# Patient Record
Sex: Female | Born: 1945
Health system: Southern US, Community
[De-identification: ages and names within clinical notes are randomized; demographics above are authoritative.]

## PROBLEM LIST (undated history)

## (undated) DIAGNOSIS — I2510 Atherosclerotic heart disease of native coronary artery without angina pectoris: Secondary | ICD-10-CM

## (undated) DIAGNOSIS — E785 Hyperlipidemia, unspecified: Secondary | ICD-10-CM

## (undated) DIAGNOSIS — I10 Essential (primary) hypertension: Secondary | ICD-10-CM

## (undated) DIAGNOSIS — L4050 Arthropathic psoriasis, unspecified: Secondary | ICD-10-CM

## (undated) HISTORY — PX: CERVICAL DISC SURGERY: SHX588

## (undated) HISTORY — PX: ORIF PATELLA FRACTURE: SUR947

---

## 2001-09-14 ENCOUNTER — Inpatient Hospital Stay (HOSPITAL_COMMUNITY): Admission: EM | Admit: 2001-09-14 | Discharge: 2001-09-17 | Payer: Self-pay | Admitting: Cardiology

## 2011-04-06 HISTORY — PX: ORIF PATELLA FRACTURE: SUR947

## 2011-04-06 HISTORY — PX: APPENDECTOMY: SHX54

## 2015-05-16 DIAGNOSIS — Z86711 Personal history of pulmonary embolism: Secondary | ICD-10-CM | POA: Diagnosis present

## 2015-05-16 DIAGNOSIS — R0789 Other chest pain: Secondary | ICD-10-CM | POA: Insufficient documentation

## 2015-05-16 HISTORY — DX: Personal history of pulmonary embolism: Z86.711

## 2015-07-08 DIAGNOSIS — Z6829 Body mass index (BMI) 29.0-29.9, adult: Secondary | ICD-10-CM | POA: Diagnosis not present

## 2015-07-08 DIAGNOSIS — I959 Hypotension, unspecified: Secondary | ICD-10-CM | POA: Diagnosis not present

## 2015-07-08 DIAGNOSIS — J019 Acute sinusitis, unspecified: Secondary | ICD-10-CM | POA: Diagnosis not present

## 2015-07-22 DIAGNOSIS — E039 Hypothyroidism, unspecified: Secondary | ICD-10-CM | POA: Diagnosis not present

## 2015-07-22 DIAGNOSIS — E1151 Type 2 diabetes mellitus with diabetic peripheral angiopathy without gangrene: Secondary | ICD-10-CM | POA: Diagnosis not present

## 2015-07-22 DIAGNOSIS — E785 Hyperlipidemia, unspecified: Secondary | ICD-10-CM | POA: Diagnosis not present

## 2015-07-22 DIAGNOSIS — E1122 Type 2 diabetes mellitus with diabetic chronic kidney disease: Secondary | ICD-10-CM | POA: Diagnosis not present

## 2015-08-01 DIAGNOSIS — E039 Hypothyroidism, unspecified: Secondary | ICD-10-CM | POA: Diagnosis not present

## 2015-08-01 DIAGNOSIS — R5383 Other fatigue: Secondary | ICD-10-CM | POA: Diagnosis not present

## 2015-08-01 DIAGNOSIS — E785 Hyperlipidemia, unspecified: Secondary | ICD-10-CM | POA: Diagnosis not present

## 2015-08-01 DIAGNOSIS — E1151 Type 2 diabetes mellitus with diabetic peripheral angiopathy without gangrene: Secondary | ICD-10-CM | POA: Diagnosis not present

## 2015-08-01 DIAGNOSIS — E1122 Type 2 diabetes mellitus with diabetic chronic kidney disease: Secondary | ICD-10-CM | POA: Diagnosis not present

## 2015-08-15 DIAGNOSIS — B2790 Infectious mononucleosis, unspecified without complication: Secondary | ICD-10-CM | POA: Diagnosis not present

## 2015-08-15 DIAGNOSIS — Z6828 Body mass index (BMI) 28.0-28.9, adult: Secondary | ICD-10-CM | POA: Diagnosis not present

## 2015-08-15 DIAGNOSIS — E559 Vitamin D deficiency, unspecified: Secondary | ICD-10-CM | POA: Diagnosis not present

## 2015-08-21 DIAGNOSIS — Z6828 Body mass index (BMI) 28.0-28.9, adult: Secondary | ICD-10-CM | POA: Diagnosis not present

## 2015-08-21 DIAGNOSIS — F419 Anxiety disorder, unspecified: Secondary | ICD-10-CM | POA: Diagnosis not present

## 2015-08-21 DIAGNOSIS — G4700 Insomnia, unspecified: Secondary | ICD-10-CM | POA: Diagnosis not present

## 2015-08-21 DIAGNOSIS — F329 Major depressive disorder, single episode, unspecified: Secondary | ICD-10-CM | POA: Diagnosis not present

## 2015-09-25 DIAGNOSIS — Z6831 Body mass index (BMI) 31.0-31.9, adult: Secondary | ICD-10-CM | POA: Diagnosis not present

## 2015-09-25 DIAGNOSIS — R197 Diarrhea, unspecified: Secondary | ICD-10-CM | POA: Diagnosis not present

## 2015-09-25 DIAGNOSIS — R109 Unspecified abdominal pain: Secondary | ICD-10-CM | POA: Diagnosis not present

## 2015-09-26 DIAGNOSIS — R197 Diarrhea, unspecified: Secondary | ICD-10-CM | POA: Diagnosis not present

## 2015-11-06 DIAGNOSIS — Z7189 Other specified counseling: Secondary | ICD-10-CM | POA: Diagnosis not present

## 2015-11-06 DIAGNOSIS — M2550 Pain in unspecified joint: Secondary | ICD-10-CM | POA: Diagnosis not present

## 2015-11-06 DIAGNOSIS — Z6831 Body mass index (BMI) 31.0-31.9, adult: Secondary | ICD-10-CM | POA: Diagnosis not present

## 2015-11-06 DIAGNOSIS — R5383 Other fatigue: Secondary | ICD-10-CM | POA: Diagnosis not present

## 2015-11-06 DIAGNOSIS — M791 Myalgia: Secondary | ICD-10-CM | POA: Diagnosis not present

## 2015-11-06 DIAGNOSIS — B2790 Infectious mononucleosis, unspecified without complication: Secondary | ICD-10-CM | POA: Diagnosis not present

## 2015-11-28 DIAGNOSIS — E119 Type 2 diabetes mellitus without complications: Secondary | ICD-10-CM | POA: Diagnosis not present

## 2015-11-28 DIAGNOSIS — I252 Old myocardial infarction: Secondary | ICD-10-CM | POA: Diagnosis not present

## 2015-11-28 DIAGNOSIS — R0602 Shortness of breath: Secondary | ICD-10-CM | POA: Diagnosis not present

## 2015-11-28 DIAGNOSIS — Z79899 Other long term (current) drug therapy: Secondary | ICD-10-CM | POA: Diagnosis not present

## 2015-11-28 DIAGNOSIS — I10 Essential (primary) hypertension: Secondary | ICD-10-CM | POA: Diagnosis not present

## 2015-11-28 DIAGNOSIS — E785 Hyperlipidemia, unspecified: Secondary | ICD-10-CM | POA: Diagnosis not present

## 2015-11-28 DIAGNOSIS — E039 Hypothyroidism, unspecified: Secondary | ICD-10-CM | POA: Diagnosis not present

## 2015-11-28 DIAGNOSIS — R079 Chest pain, unspecified: Secondary | ICD-10-CM | POA: Diagnosis not present

## 2015-11-28 DIAGNOSIS — Z86718 Personal history of other venous thrombosis and embolism: Secondary | ICD-10-CM | POA: Diagnosis not present

## 2015-11-28 DIAGNOSIS — Z7901 Long term (current) use of anticoagulants: Secondary | ICD-10-CM | POA: Diagnosis not present

## 2015-11-28 DIAGNOSIS — I249 Acute ischemic heart disease, unspecified: Secondary | ICD-10-CM | POA: Diagnosis not present

## 2015-11-28 DIAGNOSIS — D6851 Activated protein C resistance: Secondary | ICD-10-CM | POA: Diagnosis not present

## 2015-11-28 DIAGNOSIS — R0789 Other chest pain: Secondary | ICD-10-CM | POA: Diagnosis not present

## 2015-11-29 DIAGNOSIS — E039 Hypothyroidism, unspecified: Secondary | ICD-10-CM

## 2015-11-29 DIAGNOSIS — R079 Chest pain, unspecified: Secondary | ICD-10-CM | POA: Diagnosis not present

## 2015-11-29 DIAGNOSIS — E785 Hyperlipidemia, unspecified: Secondary | ICD-10-CM | POA: Diagnosis not present

## 2015-11-29 DIAGNOSIS — Z86711 Personal history of pulmonary embolism: Secondary | ICD-10-CM | POA: Diagnosis not present

## 2015-11-29 DIAGNOSIS — E119 Type 2 diabetes mellitus without complications: Secondary | ICD-10-CM

## 2015-11-29 DIAGNOSIS — I10 Essential (primary) hypertension: Secondary | ICD-10-CM | POA: Diagnosis not present

## 2015-12-03 DIAGNOSIS — E1151 Type 2 diabetes mellitus with diabetic peripheral angiopathy without gangrene: Secondary | ICD-10-CM | POA: Diagnosis not present

## 2015-12-03 DIAGNOSIS — E1122 Type 2 diabetes mellitus with diabetic chronic kidney disease: Secondary | ICD-10-CM | POA: Diagnosis not present

## 2015-12-03 DIAGNOSIS — E785 Hyperlipidemia, unspecified: Secondary | ICD-10-CM | POA: Diagnosis not present

## 2015-12-04 DIAGNOSIS — M2550 Pain in unspecified joint: Secondary | ICD-10-CM | POA: Diagnosis not present

## 2015-12-04 DIAGNOSIS — E785 Hyperlipidemia, unspecified: Secondary | ICD-10-CM | POA: Diagnosis not present

## 2015-12-04 DIAGNOSIS — E1151 Type 2 diabetes mellitus with diabetic peripheral angiopathy without gangrene: Secondary | ICD-10-CM | POA: Diagnosis not present

## 2015-12-04 DIAGNOSIS — R079 Chest pain, unspecified: Secondary | ICD-10-CM | POA: Diagnosis not present

## 2015-12-18 DIAGNOSIS — Z1231 Encounter for screening mammogram for malignant neoplasm of breast: Secondary | ICD-10-CM | POA: Diagnosis not present

## 2015-12-26 DIAGNOSIS — E039 Hypothyroidism, unspecified: Secondary | ICD-10-CM | POA: Diagnosis not present

## 2015-12-26 DIAGNOSIS — Z139 Encounter for screening, unspecified: Secondary | ICD-10-CM | POA: Diagnosis not present

## 2015-12-26 DIAGNOSIS — Z0000 Encounter for general adult medical examination without abnormal findings: Secondary | ICD-10-CM | POA: Diagnosis not present

## 2015-12-26 DIAGNOSIS — Z9181 History of falling: Secondary | ICD-10-CM | POA: Diagnosis not present

## 2015-12-26 DIAGNOSIS — E1151 Type 2 diabetes mellitus with diabetic peripheral angiopathy without gangrene: Secondary | ICD-10-CM | POA: Diagnosis not present

## 2015-12-26 DIAGNOSIS — E785 Hyperlipidemia, unspecified: Secondary | ICD-10-CM | POA: Diagnosis not present

## 2015-12-26 DIAGNOSIS — E1122 Type 2 diabetes mellitus with diabetic chronic kidney disease: Secondary | ICD-10-CM | POA: Diagnosis not present

## 2015-12-26 DIAGNOSIS — M609 Myositis, unspecified: Secondary | ICD-10-CM | POA: Diagnosis not present

## 2015-12-26 DIAGNOSIS — Z1389 Encounter for screening for other disorder: Secondary | ICD-10-CM | POA: Diagnosis not present

## 2015-12-26 DIAGNOSIS — M2550 Pain in unspecified joint: Secondary | ICD-10-CM | POA: Diagnosis not present

## 2016-01-01 DIAGNOSIS — R202 Paresthesia of skin: Secondary | ICD-10-CM | POA: Diagnosis not present

## 2016-01-01 DIAGNOSIS — E785 Hyperlipidemia, unspecified: Secondary | ICD-10-CM | POA: Diagnosis not present

## 2016-01-01 DIAGNOSIS — G8929 Other chronic pain: Secondary | ICD-10-CM | POA: Diagnosis not present

## 2016-01-01 DIAGNOSIS — M25562 Pain in left knee: Secondary | ICD-10-CM | POA: Diagnosis not present

## 2016-01-02 DIAGNOSIS — M25562 Pain in left knee: Secondary | ICD-10-CM | POA: Diagnosis not present

## 2016-01-02 DIAGNOSIS — M179 Osteoarthritis of knee, unspecified: Secondary | ICD-10-CM | POA: Diagnosis not present

## 2016-01-02 DIAGNOSIS — M25462 Effusion, left knee: Secondary | ICD-10-CM | POA: Diagnosis not present

## 2016-01-12 DIAGNOSIS — M1712 Unilateral primary osteoarthritis, left knee: Secondary | ICD-10-CM | POA: Diagnosis not present

## 2016-01-12 DIAGNOSIS — M25562 Pain in left knee: Secondary | ICD-10-CM | POA: Diagnosis not present

## 2016-01-13 DIAGNOSIS — M791 Myalgia: Secondary | ICD-10-CM | POA: Diagnosis not present

## 2016-01-13 DIAGNOSIS — T466X5A Adverse effect of antihyperlipidemic and antiarteriosclerotic drugs, initial encounter: Secondary | ICD-10-CM | POA: Diagnosis not present

## 2016-01-13 DIAGNOSIS — Z6830 Body mass index (BMI) 30.0-30.9, adult: Secondary | ICD-10-CM | POA: Diagnosis not present

## 2016-01-21 DIAGNOSIS — Z6830 Body mass index (BMI) 30.0-30.9, adult: Secondary | ICD-10-CM | POA: Diagnosis not present

## 2016-01-21 DIAGNOSIS — J019 Acute sinusitis, unspecified: Secondary | ICD-10-CM | POA: Diagnosis not present

## 2016-01-21 DIAGNOSIS — F418 Other specified anxiety disorders: Secondary | ICD-10-CM | POA: Diagnosis not present

## 2016-02-03 DIAGNOSIS — G4700 Insomnia, unspecified: Secondary | ICD-10-CM | POA: Diagnosis not present

## 2016-02-03 DIAGNOSIS — M791 Myalgia: Secondary | ICD-10-CM | POA: Diagnosis not present

## 2016-02-03 DIAGNOSIS — Z6830 Body mass index (BMI) 30.0-30.9, adult: Secondary | ICD-10-CM | POA: Diagnosis not present

## 2016-02-09 DIAGNOSIS — M1712 Unilateral primary osteoarthritis, left knee: Secondary | ICD-10-CM | POA: Diagnosis not present

## 2016-02-25 DIAGNOSIS — R748 Abnormal levels of other serum enzymes: Secondary | ICD-10-CM | POA: Diagnosis not present

## 2016-02-25 DIAGNOSIS — E1122 Type 2 diabetes mellitus with diabetic chronic kidney disease: Secondary | ICD-10-CM | POA: Diagnosis not present

## 2016-02-25 DIAGNOSIS — E1151 Type 2 diabetes mellitus with diabetic peripheral angiopathy without gangrene: Secondary | ICD-10-CM | POA: Diagnosis not present

## 2016-02-25 DIAGNOSIS — E785 Hyperlipidemia, unspecified: Secondary | ICD-10-CM | POA: Diagnosis not present

## 2016-02-26 DIAGNOSIS — M1712 Unilateral primary osteoarthritis, left knee: Secondary | ICD-10-CM | POA: Diagnosis not present

## 2016-03-08 DIAGNOSIS — E1151 Type 2 diabetes mellitus with diabetic peripheral angiopathy without gangrene: Secondary | ICD-10-CM | POA: Diagnosis not present

## 2016-03-08 DIAGNOSIS — I129 Hypertensive chronic kidney disease with stage 1 through stage 4 chronic kidney disease, or unspecified chronic kidney disease: Secondary | ICD-10-CM | POA: Diagnosis not present

## 2016-03-08 DIAGNOSIS — E785 Hyperlipidemia, unspecified: Secondary | ICD-10-CM | POA: Diagnosis not present

## 2016-03-08 DIAGNOSIS — Z23 Encounter for immunization: Secondary | ICD-10-CM | POA: Diagnosis not present

## 2016-03-08 DIAGNOSIS — E1122 Type 2 diabetes mellitus with diabetic chronic kidney disease: Secondary | ICD-10-CM | POA: Diagnosis not present

## 2016-04-13 DIAGNOSIS — R072 Precordial pain: Secondary | ICD-10-CM | POA: Diagnosis not present

## 2016-04-13 DIAGNOSIS — I252 Old myocardial infarction: Secondary | ICD-10-CM | POA: Diagnosis not present

## 2016-04-13 DIAGNOSIS — R0602 Shortness of breath: Secondary | ICD-10-CM | POA: Diagnosis not present

## 2016-04-13 DIAGNOSIS — R079 Chest pain, unspecified: Secondary | ICD-10-CM | POA: Diagnosis not present

## 2016-05-31 DIAGNOSIS — E119 Type 2 diabetes mellitus without complications: Secondary | ICD-10-CM | POA: Diagnosis not present

## 2016-05-31 DIAGNOSIS — Z961 Presence of intraocular lens: Secondary | ICD-10-CM | POA: Diagnosis not present

## 2016-06-15 DIAGNOSIS — Z6831 Body mass index (BMI) 31.0-31.9, adult: Secondary | ICD-10-CM | POA: Diagnosis not present

## 2016-06-15 DIAGNOSIS — R05 Cough: Secondary | ICD-10-CM | POA: Diagnosis not present

## 2016-06-22 DIAGNOSIS — E1151 Type 2 diabetes mellitus with diabetic peripheral angiopathy without gangrene: Secondary | ICD-10-CM | POA: Diagnosis not present

## 2016-06-22 DIAGNOSIS — E1169 Type 2 diabetes mellitus with other specified complication: Secondary | ICD-10-CM | POA: Diagnosis not present

## 2016-06-22 DIAGNOSIS — E1122 Type 2 diabetes mellitus with diabetic chronic kidney disease: Secondary | ICD-10-CM | POA: Diagnosis not present

## 2016-07-06 DIAGNOSIS — E1151 Type 2 diabetes mellitus with diabetic peripheral angiopathy without gangrene: Secondary | ICD-10-CM | POA: Diagnosis not present

## 2016-07-06 DIAGNOSIS — I129 Hypertensive chronic kidney disease with stage 1 through stage 4 chronic kidney disease, or unspecified chronic kidney disease: Secondary | ICD-10-CM | POA: Diagnosis not present

## 2016-07-06 DIAGNOSIS — N182 Chronic kidney disease, stage 2 (mild): Secondary | ICD-10-CM | POA: Diagnosis not present

## 2016-07-06 DIAGNOSIS — E785 Hyperlipidemia, unspecified: Secondary | ICD-10-CM | POA: Diagnosis not present

## 2016-08-09 DIAGNOSIS — M1712 Unilateral primary osteoarthritis, left knee: Secondary | ICD-10-CM | POA: Diagnosis not present

## 2016-08-10 DIAGNOSIS — M1712 Unilateral primary osteoarthritis, left knee: Secondary | ICD-10-CM | POA: Diagnosis not present

## 2016-08-30 DIAGNOSIS — M1712 Unilateral primary osteoarthritis, left knee: Secondary | ICD-10-CM | POA: Diagnosis not present

## 2016-09-09 DIAGNOSIS — Z6831 Body mass index (BMI) 31.0-31.9, adult: Secondary | ICD-10-CM | POA: Diagnosis not present

## 2016-09-09 DIAGNOSIS — M791 Myalgia: Secondary | ICD-10-CM | POA: Diagnosis not present

## 2016-09-23 DIAGNOSIS — Z6832 Body mass index (BMI) 32.0-32.9, adult: Secondary | ICD-10-CM | POA: Diagnosis not present

## 2016-09-23 DIAGNOSIS — N182 Chronic kidney disease, stage 2 (mild): Secondary | ICD-10-CM | POA: Diagnosis not present

## 2016-09-23 DIAGNOSIS — E1122 Type 2 diabetes mellitus with diabetic chronic kidney disease: Secondary | ICD-10-CM | POA: Diagnosis not present

## 2016-09-23 DIAGNOSIS — E1151 Type 2 diabetes mellitus with diabetic peripheral angiopathy without gangrene: Secondary | ICD-10-CM | POA: Diagnosis not present

## 2016-09-23 DIAGNOSIS — I129 Hypertensive chronic kidney disease with stage 1 through stage 4 chronic kidney disease, or unspecified chronic kidney disease: Secondary | ICD-10-CM | POA: Diagnosis not present

## 2016-11-02 DIAGNOSIS — E1169 Type 2 diabetes mellitus with other specified complication: Secondary | ICD-10-CM | POA: Diagnosis not present

## 2016-11-02 DIAGNOSIS — E1122 Type 2 diabetes mellitus with diabetic chronic kidney disease: Secondary | ICD-10-CM | POA: Diagnosis not present

## 2016-11-02 DIAGNOSIS — E1151 Type 2 diabetes mellitus with diabetic peripheral angiopathy without gangrene: Secondary | ICD-10-CM | POA: Diagnosis not present

## 2016-11-09 DIAGNOSIS — N182 Chronic kidney disease, stage 2 (mild): Secondary | ICD-10-CM | POA: Diagnosis not present

## 2016-11-09 DIAGNOSIS — I129 Hypertensive chronic kidney disease with stage 1 through stage 4 chronic kidney disease, or unspecified chronic kidney disease: Secondary | ICD-10-CM | POA: Diagnosis not present

## 2016-11-09 DIAGNOSIS — E1122 Type 2 diabetes mellitus with diabetic chronic kidney disease: Secondary | ICD-10-CM | POA: Diagnosis not present

## 2016-11-09 DIAGNOSIS — Z6831 Body mass index (BMI) 31.0-31.9, adult: Secondary | ICD-10-CM | POA: Diagnosis not present

## 2016-11-09 DIAGNOSIS — E785 Hyperlipidemia, unspecified: Secondary | ICD-10-CM | POA: Diagnosis not present

## 2016-12-09 DIAGNOSIS — M5430 Sciatica, unspecified side: Secondary | ICD-10-CM | POA: Diagnosis not present

## 2016-12-09 DIAGNOSIS — Z139 Encounter for screening, unspecified: Secondary | ICD-10-CM | POA: Diagnosis not present

## 2016-12-09 DIAGNOSIS — Z6830 Body mass index (BMI) 30.0-30.9, adult: Secondary | ICD-10-CM | POA: Diagnosis not present

## 2016-12-10 DIAGNOSIS — F3160 Bipolar disorder, current episode mixed, unspecified: Secondary | ICD-10-CM | POA: Diagnosis not present

## 2016-12-10 DIAGNOSIS — I10 Essential (primary) hypertension: Secondary | ICD-10-CM | POA: Diagnosis not present

## 2016-12-10 DIAGNOSIS — I219 Acute myocardial infarction, unspecified: Secondary | ICD-10-CM | POA: Diagnosis not present

## 2016-12-10 DIAGNOSIS — M79609 Pain in unspecified limb: Secondary | ICD-10-CM | POA: Diagnosis not present

## 2016-12-10 DIAGNOSIS — F339 Major depressive disorder, recurrent, unspecified: Secondary | ICD-10-CM | POA: Diagnosis not present

## 2016-12-24 DIAGNOSIS — J305 Allergic rhinitis due to food: Secondary | ICD-10-CM | POA: Diagnosis not present

## 2016-12-24 DIAGNOSIS — Z0000 Encounter for general adult medical examination without abnormal findings: Secondary | ICD-10-CM | POA: Diagnosis not present

## 2016-12-24 DIAGNOSIS — M5430 Sciatica, unspecified side: Secondary | ICD-10-CM | POA: Diagnosis not present

## 2016-12-24 DIAGNOSIS — G4700 Insomnia, unspecified: Secondary | ICD-10-CM | POA: Diagnosis not present

## 2016-12-24 DIAGNOSIS — J302 Other seasonal allergic rhinitis: Secondary | ICD-10-CM | POA: Diagnosis not present

## 2016-12-24 DIAGNOSIS — Z6830 Body mass index (BMI) 30.0-30.9, adult: Secondary | ICD-10-CM | POA: Diagnosis not present

## 2016-12-24 DIAGNOSIS — Z139 Encounter for screening, unspecified: Secondary | ICD-10-CM | POA: Diagnosis not present

## 2016-12-24 DIAGNOSIS — Z1389 Encounter for screening for other disorder: Secondary | ICD-10-CM | POA: Diagnosis not present

## 2016-12-27 DIAGNOSIS — J305 Allergic rhinitis due to food: Secondary | ICD-10-CM | POA: Diagnosis not present

## 2016-12-27 DIAGNOSIS — J302 Other seasonal allergic rhinitis: Secondary | ICD-10-CM | POA: Diagnosis not present

## 2016-12-28 DIAGNOSIS — J305 Allergic rhinitis due to food: Secondary | ICD-10-CM | POA: Diagnosis not present

## 2016-12-28 DIAGNOSIS — J302 Other seasonal allergic rhinitis: Secondary | ICD-10-CM | POA: Diagnosis not present

## 2016-12-29 DIAGNOSIS — J305 Allergic rhinitis due to food: Secondary | ICD-10-CM | POA: Diagnosis not present

## 2016-12-29 DIAGNOSIS — J302 Other seasonal allergic rhinitis: Secondary | ICD-10-CM | POA: Diagnosis not present

## 2016-12-30 DIAGNOSIS — J305 Allergic rhinitis due to food: Secondary | ICD-10-CM | POA: Diagnosis not present

## 2016-12-30 DIAGNOSIS — J302 Other seasonal allergic rhinitis: Secondary | ICD-10-CM | POA: Diagnosis not present

## 2016-12-31 DIAGNOSIS — J305 Allergic rhinitis due to food: Secondary | ICD-10-CM | POA: Diagnosis not present

## 2016-12-31 DIAGNOSIS — J302 Other seasonal allergic rhinitis: Secondary | ICD-10-CM | POA: Diagnosis not present

## 2017-01-03 DIAGNOSIS — J305 Allergic rhinitis due to food: Secondary | ICD-10-CM | POA: Diagnosis not present

## 2017-01-03 DIAGNOSIS — J302 Other seasonal allergic rhinitis: Secondary | ICD-10-CM | POA: Diagnosis not present

## 2017-01-04 DIAGNOSIS — J305 Allergic rhinitis due to food: Secondary | ICD-10-CM | POA: Diagnosis not present

## 2017-01-04 DIAGNOSIS — J302 Other seasonal allergic rhinitis: Secondary | ICD-10-CM | POA: Diagnosis not present

## 2017-01-05 DIAGNOSIS — J302 Other seasonal allergic rhinitis: Secondary | ICD-10-CM | POA: Diagnosis not present

## 2017-01-05 DIAGNOSIS — J305 Allergic rhinitis due to food: Secondary | ICD-10-CM | POA: Diagnosis not present

## 2017-01-06 DIAGNOSIS — J302 Other seasonal allergic rhinitis: Secondary | ICD-10-CM | POA: Diagnosis not present

## 2017-01-06 DIAGNOSIS — J305 Allergic rhinitis due to food: Secondary | ICD-10-CM | POA: Diagnosis not present

## 2017-02-17 DIAGNOSIS — Z1231 Encounter for screening mammogram for malignant neoplasm of breast: Secondary | ICD-10-CM | POA: Diagnosis not present

## 2017-02-17 DIAGNOSIS — Z23 Encounter for immunization: Secondary | ICD-10-CM | POA: Diagnosis not present

## 2017-02-17 DIAGNOSIS — Z6831 Body mass index (BMI) 31.0-31.9, adult: Secondary | ICD-10-CM | POA: Diagnosis not present

## 2017-02-17 DIAGNOSIS — M2550 Pain in unspecified joint: Secondary | ICD-10-CM | POA: Diagnosis not present

## 2017-03-02 DIAGNOSIS — Z1231 Encounter for screening mammogram for malignant neoplasm of breast: Secondary | ICD-10-CM | POA: Diagnosis not present

## 2017-03-10 DIAGNOSIS — E785 Hyperlipidemia, unspecified: Secondary | ICD-10-CM | POA: Diagnosis not present

## 2017-03-10 DIAGNOSIS — E1122 Type 2 diabetes mellitus with diabetic chronic kidney disease: Secondary | ICD-10-CM | POA: Diagnosis not present

## 2017-03-10 DIAGNOSIS — E1151 Type 2 diabetes mellitus with diabetic peripheral angiopathy without gangrene: Secondary | ICD-10-CM | POA: Diagnosis not present

## 2017-03-21 DIAGNOSIS — I129 Hypertensive chronic kidney disease with stage 1 through stage 4 chronic kidney disease, or unspecified chronic kidney disease: Secondary | ICD-10-CM | POA: Diagnosis not present

## 2017-03-21 DIAGNOSIS — R319 Hematuria, unspecified: Secondary | ICD-10-CM | POA: Diagnosis not present

## 2017-03-21 DIAGNOSIS — Z6831 Body mass index (BMI) 31.0-31.9, adult: Secondary | ICD-10-CM | POA: Diagnosis not present

## 2017-03-21 DIAGNOSIS — E1122 Type 2 diabetes mellitus with diabetic chronic kidney disease: Secondary | ICD-10-CM | POA: Diagnosis not present

## 2017-03-21 DIAGNOSIS — E039 Hypothyroidism, unspecified: Secondary | ICD-10-CM | POA: Diagnosis not present

## 2017-03-21 DIAGNOSIS — R0609 Other forms of dyspnea: Secondary | ICD-10-CM | POA: Diagnosis not present

## 2017-03-21 DIAGNOSIS — N182 Chronic kidney disease, stage 2 (mild): Secondary | ICD-10-CM | POA: Diagnosis not present

## 2017-03-25 DIAGNOSIS — J00 Acute nasopharyngitis [common cold]: Secondary | ICD-10-CM | POA: Diagnosis not present

## 2017-05-31 DIAGNOSIS — E119 Type 2 diabetes mellitus without complications: Secondary | ICD-10-CM | POA: Diagnosis not present

## 2017-05-31 DIAGNOSIS — Z961 Presence of intraocular lens: Secondary | ICD-10-CM | POA: Diagnosis not present

## 2019-09-19 DIAGNOSIS — Z6832 Body mass index (BMI) 32.0-32.9, adult: Secondary | ICD-10-CM | POA: Diagnosis not present

## 2019-09-19 DIAGNOSIS — D682 Hereditary deficiency of other clotting factors: Secondary | ICD-10-CM | POA: Diagnosis not present

## 2019-09-19 DIAGNOSIS — S39012A Strain of muscle, fascia and tendon of lower back, initial encounter: Secondary | ICD-10-CM | POA: Diagnosis not present

## 2019-09-20 DIAGNOSIS — M064 Inflammatory polyarthropathy: Secondary | ICD-10-CM | POA: Diagnosis not present

## 2019-09-20 DIAGNOSIS — Z79899 Other long term (current) drug therapy: Secondary | ICD-10-CM | POA: Diagnosis not present

## 2019-09-25 DIAGNOSIS — H33322 Round hole, left eye: Secondary | ICD-10-CM | POA: Diagnosis not present

## 2019-10-01 DIAGNOSIS — Z1231 Encounter for screening mammogram for malignant neoplasm of breast: Secondary | ICD-10-CM | POA: Diagnosis not present

## 2019-10-10 DIAGNOSIS — H33322 Round hole, left eye: Secondary | ICD-10-CM | POA: Diagnosis not present

## 2019-10-12 DIAGNOSIS — E039 Hypothyroidism, unspecified: Secondary | ICD-10-CM | POA: Diagnosis not present

## 2019-10-12 DIAGNOSIS — D849 Immunodeficiency, unspecified: Secondary | ICD-10-CM | POA: Diagnosis not present

## 2019-10-12 DIAGNOSIS — E1151 Type 2 diabetes mellitus with diabetic peripheral angiopathy without gangrene: Secondary | ICD-10-CM | POA: Diagnosis not present

## 2019-10-12 DIAGNOSIS — E1169 Type 2 diabetes mellitus with other specified complication: Secondary | ICD-10-CM | POA: Diagnosis not present

## 2019-10-15 DIAGNOSIS — M545 Low back pain: Secondary | ICD-10-CM | POA: Diagnosis not present

## 2019-10-15 DIAGNOSIS — M5416 Radiculopathy, lumbar region: Secondary | ICD-10-CM | POA: Diagnosis not present

## 2019-10-18 ENCOUNTER — Other Ambulatory Visit: Payer: Self-pay | Admitting: Orthopedic Surgery

## 2019-10-18 DIAGNOSIS — M8580 Other specified disorders of bone density and structure, unspecified site: Secondary | ICD-10-CM | POA: Diagnosis not present

## 2019-10-18 DIAGNOSIS — M791 Myalgia, unspecified site: Secondary | ICD-10-CM | POA: Diagnosis not present

## 2019-10-18 DIAGNOSIS — M1990 Unspecified osteoarthritis, unspecified site: Secondary | ICD-10-CM | POA: Diagnosis not present

## 2019-10-18 DIAGNOSIS — L409 Psoriasis, unspecified: Secondary | ICD-10-CM | POA: Diagnosis not present

## 2019-10-18 DIAGNOSIS — L4050 Arthropathic psoriasis, unspecified: Secondary | ICD-10-CM | POA: Diagnosis not present

## 2019-10-18 DIAGNOSIS — Z79899 Other long term (current) drug therapy: Secondary | ICD-10-CM | POA: Diagnosis not present

## 2019-10-19 ENCOUNTER — Other Ambulatory Visit: Payer: Self-pay | Admitting: Orthopedic Surgery

## 2019-10-19 DIAGNOSIS — M545 Low back pain, unspecified: Secondary | ICD-10-CM

## 2019-10-22 DIAGNOSIS — M545 Low back pain: Secondary | ICD-10-CM | POA: Diagnosis not present

## 2019-10-23 ENCOUNTER — Ambulatory Visit
Admission: RE | Admit: 2019-10-23 | Discharge: 2019-10-23 | Disposition: A | Discharge status: Home / Self Care | Payer: Medicare PPO | Source: Ambulatory Visit | Attending: Orthopedic Surgery | Admitting: Orthopedic Surgery

## 2019-10-23 ENCOUNTER — Other Ambulatory Visit: Payer: Self-pay

## 2019-10-23 DIAGNOSIS — M545 Low back pain, unspecified: Secondary | ICD-10-CM

## 2019-10-23 DIAGNOSIS — M48061 Spinal stenosis, lumbar region without neurogenic claudication: Secondary | ICD-10-CM | POA: Diagnosis not present

## 2019-10-24 DIAGNOSIS — R531 Weakness: Secondary | ICD-10-CM | POA: Diagnosis not present

## 2019-10-24 DIAGNOSIS — M5416 Radiculopathy, lumbar region: Secondary | ICD-10-CM | POA: Diagnosis not present

## 2019-10-24 DIAGNOSIS — M1990 Unspecified osteoarthritis, unspecified site: Secondary | ICD-10-CM | POA: Diagnosis not present

## 2019-10-24 DIAGNOSIS — M2560 Stiffness of unspecified joint, not elsewhere classified: Secondary | ICD-10-CM | POA: Diagnosis not present

## 2019-10-24 DIAGNOSIS — M545 Low back pain: Secondary | ICD-10-CM | POA: Diagnosis not present

## 2019-10-24 DIAGNOSIS — R262 Difficulty in walking, not elsewhere classified: Secondary | ICD-10-CM | POA: Diagnosis not present

## 2019-10-24 DIAGNOSIS — M25552 Pain in left hip: Secondary | ICD-10-CM | POA: Diagnosis not present

## 2019-10-26 DIAGNOSIS — M5416 Radiculopathy, lumbar region: Secondary | ICD-10-CM | POA: Diagnosis not present

## 2019-10-26 DIAGNOSIS — R531 Weakness: Secondary | ICD-10-CM | POA: Diagnosis not present

## 2019-10-26 DIAGNOSIS — M25552 Pain in left hip: Secondary | ICD-10-CM | POA: Diagnosis not present

## 2019-10-26 DIAGNOSIS — M2560 Stiffness of unspecified joint, not elsewhere classified: Secondary | ICD-10-CM | POA: Diagnosis not present

## 2019-10-26 DIAGNOSIS — M1990 Unspecified osteoarthritis, unspecified site: Secondary | ICD-10-CM | POA: Diagnosis not present

## 2019-10-26 DIAGNOSIS — R262 Difficulty in walking, not elsewhere classified: Secondary | ICD-10-CM | POA: Diagnosis not present

## 2019-10-26 DIAGNOSIS — M545 Low back pain: Secondary | ICD-10-CM | POA: Diagnosis not present

## 2019-10-29 DIAGNOSIS — M1990 Unspecified osteoarthritis, unspecified site: Secondary | ICD-10-CM | POA: Diagnosis not present

## 2019-10-29 DIAGNOSIS — R531 Weakness: Secondary | ICD-10-CM | POA: Diagnosis not present

## 2019-10-29 DIAGNOSIS — M25552 Pain in left hip: Secondary | ICD-10-CM | POA: Diagnosis not present

## 2019-10-29 DIAGNOSIS — M5416 Radiculopathy, lumbar region: Secondary | ICD-10-CM | POA: Diagnosis not present

## 2019-10-29 DIAGNOSIS — M2560 Stiffness of unspecified joint, not elsewhere classified: Secondary | ICD-10-CM | POA: Diagnosis not present

## 2019-10-29 DIAGNOSIS — M545 Low back pain: Secondary | ICD-10-CM | POA: Diagnosis not present

## 2019-10-29 DIAGNOSIS — R262 Difficulty in walking, not elsewhere classified: Secondary | ICD-10-CM | POA: Diagnosis not present

## 2019-10-30 DIAGNOSIS — R531 Weakness: Secondary | ICD-10-CM | POA: Diagnosis not present

## 2019-10-30 DIAGNOSIS — M2560 Stiffness of unspecified joint, not elsewhere classified: Secondary | ICD-10-CM | POA: Diagnosis not present

## 2019-10-30 DIAGNOSIS — M1990 Unspecified osteoarthritis, unspecified site: Secondary | ICD-10-CM | POA: Diagnosis not present

## 2019-10-30 DIAGNOSIS — R262 Difficulty in walking, not elsewhere classified: Secondary | ICD-10-CM | POA: Diagnosis not present

## 2019-10-30 DIAGNOSIS — M25552 Pain in left hip: Secondary | ICD-10-CM | POA: Diagnosis not present

## 2019-10-30 DIAGNOSIS — M545 Low back pain: Secondary | ICD-10-CM | POA: Diagnosis not present

## 2019-10-30 DIAGNOSIS — M5416 Radiculopathy, lumbar region: Secondary | ICD-10-CM | POA: Diagnosis not present

## 2019-11-02 DIAGNOSIS — M1990 Unspecified osteoarthritis, unspecified site: Secondary | ICD-10-CM | POA: Diagnosis not present

## 2019-11-02 DIAGNOSIS — M25552 Pain in left hip: Secondary | ICD-10-CM | POA: Diagnosis not present

## 2019-11-02 DIAGNOSIS — R531 Weakness: Secondary | ICD-10-CM | POA: Diagnosis not present

## 2019-11-02 DIAGNOSIS — R262 Difficulty in walking, not elsewhere classified: Secondary | ICD-10-CM | POA: Diagnosis not present

## 2019-11-02 DIAGNOSIS — M5416 Radiculopathy, lumbar region: Secondary | ICD-10-CM | POA: Diagnosis not present

## 2019-11-02 DIAGNOSIS — M545 Low back pain: Secondary | ICD-10-CM | POA: Diagnosis not present

## 2019-11-02 DIAGNOSIS — M2560 Stiffness of unspecified joint, not elsewhere classified: Secondary | ICD-10-CM | POA: Diagnosis not present

## 2019-11-06 DIAGNOSIS — M5416 Radiculopathy, lumbar region: Secondary | ICD-10-CM | POA: Diagnosis not present

## 2019-11-06 DIAGNOSIS — M4696 Unspecified inflammatory spondylopathy, lumbar region: Secondary | ICD-10-CM | POA: Diagnosis not present

## 2019-11-06 DIAGNOSIS — M545 Low back pain: Secondary | ICD-10-CM | POA: Diagnosis not present

## 2019-11-08 DIAGNOSIS — M791 Myalgia, unspecified site: Secondary | ICD-10-CM | POA: Diagnosis not present

## 2019-11-08 DIAGNOSIS — M8580 Other specified disorders of bone density and structure, unspecified site: Secondary | ICD-10-CM | POA: Diagnosis not present

## 2019-11-08 DIAGNOSIS — Z79899 Other long term (current) drug therapy: Secondary | ICD-10-CM | POA: Diagnosis not present

## 2019-11-08 DIAGNOSIS — M25552 Pain in left hip: Secondary | ICD-10-CM | POA: Diagnosis not present

## 2019-11-08 DIAGNOSIS — M1990 Unspecified osteoarthritis, unspecified site: Secondary | ICD-10-CM | POA: Diagnosis not present

## 2019-11-08 DIAGNOSIS — R531 Weakness: Secondary | ICD-10-CM | POA: Diagnosis not present

## 2019-11-08 DIAGNOSIS — M7581 Other shoulder lesions, right shoulder: Secondary | ICD-10-CM | POA: Diagnosis not present

## 2019-11-08 DIAGNOSIS — M25511 Pain in right shoulder: Secondary | ICD-10-CM | POA: Diagnosis not present

## 2019-11-08 DIAGNOSIS — L409 Psoriasis, unspecified: Secondary | ICD-10-CM | POA: Diagnosis not present

## 2019-11-08 DIAGNOSIS — M5416 Radiculopathy, lumbar region: Secondary | ICD-10-CM | POA: Diagnosis not present

## 2019-11-08 DIAGNOSIS — R262 Difficulty in walking, not elsewhere classified: Secondary | ICD-10-CM | POA: Diagnosis not present

## 2019-11-08 DIAGNOSIS — M2560 Stiffness of unspecified joint, not elsewhere classified: Secondary | ICD-10-CM | POA: Diagnosis not present

## 2019-11-08 DIAGNOSIS — M545 Low back pain: Secondary | ICD-10-CM | POA: Diagnosis not present

## 2019-11-08 DIAGNOSIS — L4050 Arthropathic psoriasis, unspecified: Secondary | ICD-10-CM | POA: Diagnosis not present

## 2019-11-12 DIAGNOSIS — D849 Immunodeficiency, unspecified: Secondary | ICD-10-CM | POA: Diagnosis not present

## 2019-11-12 DIAGNOSIS — E1169 Type 2 diabetes mellitus with other specified complication: Secondary | ICD-10-CM | POA: Diagnosis not present

## 2019-11-12 DIAGNOSIS — E039 Hypothyroidism, unspecified: Secondary | ICD-10-CM | POA: Diagnosis not present

## 2019-11-12 DIAGNOSIS — E1151 Type 2 diabetes mellitus with diabetic peripheral angiopathy without gangrene: Secondary | ICD-10-CM | POA: Diagnosis not present

## 2019-11-15 DIAGNOSIS — M25552 Pain in left hip: Secondary | ICD-10-CM | POA: Diagnosis not present

## 2019-11-15 DIAGNOSIS — R262 Difficulty in walking, not elsewhere classified: Secondary | ICD-10-CM | POA: Diagnosis not present

## 2019-11-15 DIAGNOSIS — M545 Low back pain: Secondary | ICD-10-CM | POA: Diagnosis not present

## 2019-11-15 DIAGNOSIS — M5416 Radiculopathy, lumbar region: Secondary | ICD-10-CM | POA: Diagnosis not present

## 2019-11-15 DIAGNOSIS — R531 Weakness: Secondary | ICD-10-CM | POA: Diagnosis not present

## 2019-11-15 DIAGNOSIS — M1990 Unspecified osteoarthritis, unspecified site: Secondary | ICD-10-CM | POA: Diagnosis not present

## 2019-11-15 DIAGNOSIS — M2560 Stiffness of unspecified joint, not elsewhere classified: Secondary | ICD-10-CM | POA: Diagnosis not present

## 2019-11-16 ENCOUNTER — Other Ambulatory Visit: Payer: PPO

## 2019-11-19 DIAGNOSIS — F339 Major depressive disorder, recurrent, unspecified: Secondary | ICD-10-CM | POA: Diagnosis not present

## 2019-11-19 DIAGNOSIS — Z1339 Encounter for screening examination for other mental health and behavioral disorders: Secondary | ICD-10-CM | POA: Diagnosis not present

## 2019-11-19 DIAGNOSIS — E039 Hypothyroidism, unspecified: Secondary | ICD-10-CM | POA: Diagnosis not present

## 2019-11-19 DIAGNOSIS — M47816 Spondylosis without myelopathy or radiculopathy, lumbar region: Secondary | ICD-10-CM | POA: Diagnosis not present

## 2019-11-19 DIAGNOSIS — Z136 Encounter for screening for cardiovascular disorders: Secondary | ICD-10-CM | POA: Diagnosis not present

## 2019-11-19 DIAGNOSIS — Z7189 Other specified counseling: Secondary | ICD-10-CM | POA: Diagnosis not present

## 2019-11-19 DIAGNOSIS — Z0000 Encounter for general adult medical examination without abnormal findings: Secondary | ICD-10-CM | POA: Diagnosis not present

## 2019-11-19 DIAGNOSIS — Z139 Encounter for screening, unspecified: Secondary | ICD-10-CM | POA: Diagnosis not present

## 2019-11-19 DIAGNOSIS — E669 Obesity, unspecified: Secondary | ICD-10-CM | POA: Diagnosis not present

## 2019-11-19 DIAGNOSIS — E1169 Type 2 diabetes mellitus with other specified complication: Secondary | ICD-10-CM | POA: Diagnosis not present

## 2019-11-19 DIAGNOSIS — E785 Hyperlipidemia, unspecified: Secondary | ICD-10-CM | POA: Diagnosis not present

## 2019-11-21 DIAGNOSIS — R233 Spontaneous ecchymoses: Secondary | ICD-10-CM | POA: Diagnosis not present

## 2019-11-21 DIAGNOSIS — L564 Polymorphous light eruption: Secondary | ICD-10-CM | POA: Diagnosis not present

## 2019-11-26 DIAGNOSIS — I129 Hypertensive chronic kidney disease with stage 1 through stage 4 chronic kidney disease, or unspecified chronic kidney disease: Secondary | ICD-10-CM | POA: Diagnosis not present

## 2019-11-26 DIAGNOSIS — E1122 Type 2 diabetes mellitus with diabetic chronic kidney disease: Secondary | ICD-10-CM | POA: Diagnosis not present

## 2019-11-26 DIAGNOSIS — N182 Chronic kidney disease, stage 2 (mild): Secondary | ICD-10-CM | POA: Diagnosis not present

## 2019-11-26 DIAGNOSIS — E1169 Type 2 diabetes mellitus with other specified complication: Secondary | ICD-10-CM | POA: Diagnosis not present

## 2019-11-26 DIAGNOSIS — F339 Major depressive disorder, recurrent, unspecified: Secondary | ICD-10-CM | POA: Diagnosis not present

## 2019-11-26 DIAGNOSIS — Z139 Encounter for screening, unspecified: Secondary | ICD-10-CM | POA: Diagnosis not present

## 2019-12-06 DIAGNOSIS — Z6832 Body mass index (BMI) 32.0-32.9, adult: Secondary | ICD-10-CM | POA: Diagnosis not present

## 2019-12-06 DIAGNOSIS — I70209 Unspecified atherosclerosis of native arteries of extremities, unspecified extremity: Secondary | ICD-10-CM | POA: Diagnosis not present

## 2019-12-18 DIAGNOSIS — M1990 Unspecified osteoarthritis, unspecified site: Secondary | ICD-10-CM | POA: Diagnosis not present

## 2019-12-18 DIAGNOSIS — M791 Myalgia, unspecified site: Secondary | ICD-10-CM | POA: Diagnosis not present

## 2019-12-18 DIAGNOSIS — M79672 Pain in left foot: Secondary | ICD-10-CM | POA: Diagnosis not present

## 2019-12-18 DIAGNOSIS — M8580 Other specified disorders of bone density and structure, unspecified site: Secondary | ICD-10-CM | POA: Diagnosis not present

## 2019-12-18 DIAGNOSIS — L409 Psoriasis, unspecified: Secondary | ICD-10-CM | POA: Diagnosis not present

## 2019-12-18 DIAGNOSIS — M65872 Other synovitis and tenosynovitis, left ankle and foot: Secondary | ICD-10-CM | POA: Diagnosis not present

## 2019-12-18 DIAGNOSIS — M25572 Pain in left ankle and joints of left foot: Secondary | ICD-10-CM | POA: Diagnosis not present

## 2019-12-18 DIAGNOSIS — Z79899 Other long term (current) drug therapy: Secondary | ICD-10-CM | POA: Diagnosis not present

## 2019-12-18 DIAGNOSIS — L4050 Arthropathic psoriasis, unspecified: Secondary | ICD-10-CM | POA: Diagnosis not present

## 2019-12-20 DIAGNOSIS — M47816 Spondylosis without myelopathy or radiculopathy, lumbar region: Secondary | ICD-10-CM | POA: Diagnosis not present

## 2020-01-11 DIAGNOSIS — M47816 Spondylosis without myelopathy or radiculopathy, lumbar region: Secondary | ICD-10-CM | POA: Diagnosis not present

## 2020-01-21 DIAGNOSIS — E1122 Type 2 diabetes mellitus with diabetic chronic kidney disease: Secondary | ICD-10-CM | POA: Diagnosis not present

## 2020-01-21 DIAGNOSIS — L4050 Arthropathic psoriasis, unspecified: Secondary | ICD-10-CM | POA: Diagnosis not present

## 2020-01-21 DIAGNOSIS — M7072 Other bursitis of hip, left hip: Secondary | ICD-10-CM | POA: Diagnosis not present

## 2020-01-21 DIAGNOSIS — Z6832 Body mass index (BMI) 32.0-32.9, adult: Secondary | ICD-10-CM | POA: Diagnosis not present

## 2020-01-29 DIAGNOSIS — M791 Myalgia, unspecified site: Secondary | ICD-10-CM | POA: Diagnosis not present

## 2020-01-29 DIAGNOSIS — Z79899 Other long term (current) drug therapy: Secondary | ICD-10-CM | POA: Diagnosis not present

## 2020-01-29 DIAGNOSIS — M25572 Pain in left ankle and joints of left foot: Secondary | ICD-10-CM | POA: Diagnosis not present

## 2020-01-29 DIAGNOSIS — L409 Psoriasis, unspecified: Secondary | ICD-10-CM | POA: Diagnosis not present

## 2020-01-29 DIAGNOSIS — M65872 Other synovitis and tenosynovitis, left ankle and foot: Secondary | ICD-10-CM | POA: Diagnosis not present

## 2020-01-29 DIAGNOSIS — M1990 Unspecified osteoarthritis, unspecified site: Secondary | ICD-10-CM | POA: Diagnosis not present

## 2020-01-29 DIAGNOSIS — M79672 Pain in left foot: Secondary | ICD-10-CM | POA: Diagnosis not present

## 2020-01-29 DIAGNOSIS — L4050 Arthropathic psoriasis, unspecified: Secondary | ICD-10-CM | POA: Diagnosis not present

## 2020-01-29 DIAGNOSIS — M8580 Other specified disorders of bone density and structure, unspecified site: Secondary | ICD-10-CM | POA: Diagnosis not present

## 2020-02-13 DIAGNOSIS — M47816 Spondylosis without myelopathy or radiculopathy, lumbar region: Secondary | ICD-10-CM | POA: Diagnosis not present

## 2020-02-26 ENCOUNTER — Encounter: Payer: Medicare PPO | Admitting: Physical Medicine & Rehabilitation

## 2020-03-07 DIAGNOSIS — Z79899 Other long term (current) drug therapy: Secondary | ICD-10-CM | POA: Diagnosis not present

## 2020-03-07 DIAGNOSIS — L409 Psoriasis, unspecified: Secondary | ICD-10-CM | POA: Diagnosis not present

## 2020-03-07 DIAGNOSIS — M79676 Pain in unspecified toe(s): Secondary | ICD-10-CM | POA: Diagnosis not present

## 2020-03-07 DIAGNOSIS — M7989 Other specified soft tissue disorders: Secondary | ICD-10-CM | POA: Diagnosis not present

## 2020-03-07 DIAGNOSIS — M1990 Unspecified osteoarthritis, unspecified site: Secondary | ICD-10-CM | POA: Diagnosis not present

## 2020-03-07 DIAGNOSIS — L4050 Arthropathic psoriasis, unspecified: Secondary | ICD-10-CM | POA: Diagnosis not present

## 2020-03-07 DIAGNOSIS — M8580 Other specified disorders of bone density and structure, unspecified site: Secondary | ICD-10-CM | POA: Diagnosis not present

## 2020-03-07 DIAGNOSIS — M25511 Pain in right shoulder: Secondary | ICD-10-CM | POA: Diagnosis not present

## 2020-03-07 DIAGNOSIS — M791 Myalgia, unspecified site: Secondary | ICD-10-CM | POA: Diagnosis not present

## 2020-03-11 DIAGNOSIS — M47816 Spondylosis without myelopathy or radiculopathy, lumbar region: Secondary | ICD-10-CM | POA: Diagnosis not present

## 2020-03-24 DIAGNOSIS — M47816 Spondylosis without myelopathy or radiculopathy, lumbar region: Secondary | ICD-10-CM | POA: Diagnosis not present

## 2020-03-27 DIAGNOSIS — E785 Hyperlipidemia, unspecified: Secondary | ICD-10-CM | POA: Diagnosis not present

## 2020-03-27 DIAGNOSIS — E039 Hypothyroidism, unspecified: Secondary | ICD-10-CM | POA: Diagnosis not present

## 2020-03-27 DIAGNOSIS — E1122 Type 2 diabetes mellitus with diabetic chronic kidney disease: Secondary | ICD-10-CM | POA: Diagnosis not present

## 2020-04-03 DIAGNOSIS — E039 Hypothyroidism, unspecified: Secondary | ICD-10-CM | POA: Diagnosis not present

## 2020-04-03 DIAGNOSIS — E1122 Type 2 diabetes mellitus with diabetic chronic kidney disease: Secondary | ICD-10-CM | POA: Diagnosis not present

## 2020-04-03 DIAGNOSIS — Z23 Encounter for immunization: Secondary | ICD-10-CM | POA: Diagnosis not present

## 2020-04-03 DIAGNOSIS — I129 Hypertensive chronic kidney disease with stage 1 through stage 4 chronic kidney disease, or unspecified chronic kidney disease: Secondary | ICD-10-CM | POA: Diagnosis not present

## 2020-04-03 DIAGNOSIS — N182 Chronic kidney disease, stage 2 (mild): Secondary | ICD-10-CM | POA: Diagnosis not present

## 2020-04-03 DIAGNOSIS — Z139 Encounter for screening, unspecified: Secondary | ICD-10-CM | POA: Diagnosis not present

## 2020-04-24 DIAGNOSIS — M47816 Spondylosis without myelopathy or radiculopathy, lumbar region: Secondary | ICD-10-CM | POA: Diagnosis not present

## 2020-04-30 DIAGNOSIS — L4050 Arthropathic psoriasis, unspecified: Secondary | ICD-10-CM | POA: Diagnosis not present

## 2020-04-30 DIAGNOSIS — M8580 Other specified disorders of bone density and structure, unspecified site: Secondary | ICD-10-CM | POA: Diagnosis not present

## 2020-04-30 DIAGNOSIS — M7062 Trochanteric bursitis, left hip: Secondary | ICD-10-CM | POA: Diagnosis not present

## 2020-04-30 DIAGNOSIS — Z79899 Other long term (current) drug therapy: Secondary | ICD-10-CM | POA: Diagnosis not present

## 2020-04-30 DIAGNOSIS — L409 Psoriasis, unspecified: Secondary | ICD-10-CM | POA: Diagnosis not present

## 2020-04-30 DIAGNOSIS — M1990 Unspecified osteoarthritis, unspecified site: Secondary | ICD-10-CM | POA: Diagnosis not present

## 2020-04-30 DIAGNOSIS — M7581 Other shoulder lesions, right shoulder: Secondary | ICD-10-CM | POA: Diagnosis not present

## 2020-04-30 DIAGNOSIS — M791 Myalgia, unspecified site: Secondary | ICD-10-CM | POA: Diagnosis not present

## 2020-05-29 DIAGNOSIS — Z79899 Other long term (current) drug therapy: Secondary | ICD-10-CM | POA: Diagnosis not present

## 2020-05-29 DIAGNOSIS — M1990 Unspecified osteoarthritis, unspecified site: Secondary | ICD-10-CM | POA: Diagnosis not present

## 2020-05-29 DIAGNOSIS — M7062 Trochanteric bursitis, left hip: Secondary | ICD-10-CM | POA: Diagnosis not present

## 2020-05-29 DIAGNOSIS — M8580 Other specified disorders of bone density and structure, unspecified site: Secondary | ICD-10-CM | POA: Diagnosis not present

## 2020-05-29 DIAGNOSIS — L4050 Arthropathic psoriasis, unspecified: Secondary | ICD-10-CM | POA: Diagnosis not present

## 2020-05-29 DIAGNOSIS — L409 Psoriasis, unspecified: Secondary | ICD-10-CM | POA: Diagnosis not present

## 2020-05-29 DIAGNOSIS — M7581 Other shoulder lesions, right shoulder: Secondary | ICD-10-CM | POA: Diagnosis not present

## 2020-05-29 DIAGNOSIS — M25552 Pain in left hip: Secondary | ICD-10-CM | POA: Diagnosis not present

## 2020-05-29 DIAGNOSIS — M791 Myalgia, unspecified site: Secondary | ICD-10-CM | POA: Diagnosis not present

## 2020-05-30 DIAGNOSIS — M47816 Spondylosis without myelopathy or radiculopathy, lumbar region: Secondary | ICD-10-CM | POA: Diagnosis not present

## 2020-06-19 DIAGNOSIS — E119 Type 2 diabetes mellitus without complications: Secondary | ICD-10-CM | POA: Diagnosis not present

## 2020-06-19 DIAGNOSIS — Z79899 Other long term (current) drug therapy: Secondary | ICD-10-CM | POA: Diagnosis not present

## 2020-07-31 DIAGNOSIS — M7581 Other shoulder lesions, right shoulder: Secondary | ICD-10-CM | POA: Diagnosis not present

## 2020-07-31 DIAGNOSIS — Z79899 Other long term (current) drug therapy: Secondary | ICD-10-CM | POA: Diagnosis not present

## 2020-07-31 DIAGNOSIS — E785 Hyperlipidemia, unspecified: Secondary | ICD-10-CM | POA: Diagnosis not present

## 2020-07-31 DIAGNOSIS — E039 Hypothyroidism, unspecified: Secondary | ICD-10-CM | POA: Diagnosis not present

## 2020-07-31 DIAGNOSIS — M1990 Unspecified osteoarthritis, unspecified site: Secondary | ICD-10-CM | POA: Diagnosis not present

## 2020-07-31 DIAGNOSIS — M25552 Pain in left hip: Secondary | ICD-10-CM | POA: Diagnosis not present

## 2020-07-31 DIAGNOSIS — M791 Myalgia, unspecified site: Secondary | ICD-10-CM | POA: Diagnosis not present

## 2020-07-31 DIAGNOSIS — L4050 Arthropathic psoriasis, unspecified: Secondary | ICD-10-CM | POA: Diagnosis not present

## 2020-07-31 DIAGNOSIS — M7062 Trochanteric bursitis, left hip: Secondary | ICD-10-CM | POA: Diagnosis not present

## 2020-07-31 DIAGNOSIS — E1122 Type 2 diabetes mellitus with diabetic chronic kidney disease: Secondary | ICD-10-CM | POA: Diagnosis not present

## 2020-07-31 DIAGNOSIS — M8580 Other specified disorders of bone density and structure, unspecified site: Secondary | ICD-10-CM | POA: Diagnosis not present

## 2020-07-31 DIAGNOSIS — L409 Psoriasis, unspecified: Secondary | ICD-10-CM | POA: Diagnosis not present

## 2020-08-05 DIAGNOSIS — M25552 Pain in left hip: Secondary | ICD-10-CM | POA: Diagnosis not present

## 2020-08-05 DIAGNOSIS — R531 Weakness: Secondary | ICD-10-CM | POA: Diagnosis not present

## 2020-08-07 DIAGNOSIS — N182 Chronic kidney disease, stage 2 (mild): Secondary | ICD-10-CM | POA: Diagnosis not present

## 2020-08-07 DIAGNOSIS — R531 Weakness: Secondary | ICD-10-CM | POA: Diagnosis not present

## 2020-08-07 DIAGNOSIS — Z23 Encounter for immunization: Secondary | ICD-10-CM | POA: Diagnosis not present

## 2020-08-07 DIAGNOSIS — E039 Hypothyroidism, unspecified: Secondary | ICD-10-CM | POA: Diagnosis not present

## 2020-08-07 DIAGNOSIS — I129 Hypertensive chronic kidney disease with stage 1 through stage 4 chronic kidney disease, or unspecified chronic kidney disease: Secondary | ICD-10-CM | POA: Diagnosis not present

## 2020-08-07 DIAGNOSIS — E785 Hyperlipidemia, unspecified: Secondary | ICD-10-CM | POA: Diagnosis not present

## 2020-08-07 DIAGNOSIS — M25552 Pain in left hip: Secondary | ICD-10-CM | POA: Diagnosis not present

## 2020-08-07 DIAGNOSIS — E1122 Type 2 diabetes mellitus with diabetic chronic kidney disease: Secondary | ICD-10-CM | POA: Diagnosis not present

## 2020-08-07 DIAGNOSIS — R490 Dysphonia: Secondary | ICD-10-CM | POA: Diagnosis not present

## 2020-08-11 DIAGNOSIS — R531 Weakness: Secondary | ICD-10-CM | POA: Diagnosis not present

## 2020-08-11 DIAGNOSIS — M25552 Pain in left hip: Secondary | ICD-10-CM | POA: Diagnosis not present

## 2020-08-13 DIAGNOSIS — M25552 Pain in left hip: Secondary | ICD-10-CM | POA: Diagnosis not present

## 2020-08-13 DIAGNOSIS — R531 Weakness: Secondary | ICD-10-CM | POA: Diagnosis not present

## 2020-08-15 DIAGNOSIS — R531 Weakness: Secondary | ICD-10-CM | POA: Diagnosis not present

## 2020-08-15 DIAGNOSIS — M25552 Pain in left hip: Secondary | ICD-10-CM | POA: Diagnosis not present

## 2020-08-18 DIAGNOSIS — R531 Weakness: Secondary | ICD-10-CM | POA: Diagnosis not present

## 2020-08-18 DIAGNOSIS — M25552 Pain in left hip: Secondary | ICD-10-CM | POA: Diagnosis not present

## 2020-08-22 DIAGNOSIS — M25552 Pain in left hip: Secondary | ICD-10-CM | POA: Diagnosis not present

## 2020-08-22 DIAGNOSIS — R531 Weakness: Secondary | ICD-10-CM | POA: Diagnosis not present

## 2020-08-25 DIAGNOSIS — Z139 Encounter for screening, unspecified: Secondary | ICD-10-CM | POA: Diagnosis not present

## 2020-08-25 DIAGNOSIS — J029 Acute pharyngitis, unspecified: Secondary | ICD-10-CM | POA: Diagnosis not present

## 2020-08-25 DIAGNOSIS — M25552 Pain in left hip: Secondary | ICD-10-CM | POA: Diagnosis not present

## 2020-08-25 DIAGNOSIS — R531 Weakness: Secondary | ICD-10-CM | POA: Diagnosis not present

## 2020-08-25 DIAGNOSIS — J309 Allergic rhinitis, unspecified: Secondary | ICD-10-CM | POA: Diagnosis not present

## 2020-08-28 DIAGNOSIS — R531 Weakness: Secondary | ICD-10-CM | POA: Diagnosis not present

## 2020-08-28 DIAGNOSIS — M25552 Pain in left hip: Secondary | ICD-10-CM | POA: Diagnosis not present

## 2020-09-04 DIAGNOSIS — R531 Weakness: Secondary | ICD-10-CM | POA: Diagnosis not present

## 2020-09-04 DIAGNOSIS — M25552 Pain in left hip: Secondary | ICD-10-CM | POA: Diagnosis not present

## 2020-09-08 DIAGNOSIS — M25552 Pain in left hip: Secondary | ICD-10-CM | POA: Diagnosis not present

## 2020-09-08 DIAGNOSIS — R531 Weakness: Secondary | ICD-10-CM | POA: Diagnosis not present

## 2020-09-12 DIAGNOSIS — M25552 Pain in left hip: Secondary | ICD-10-CM | POA: Diagnosis not present

## 2020-09-12 DIAGNOSIS — R531 Weakness: Secondary | ICD-10-CM | POA: Diagnosis not present

## 2020-09-16 DIAGNOSIS — M25552 Pain in left hip: Secondary | ICD-10-CM | POA: Diagnosis not present

## 2020-09-16 DIAGNOSIS — R531 Weakness: Secondary | ICD-10-CM | POA: Diagnosis not present

## 2020-10-02 DIAGNOSIS — Z1231 Encounter for screening mammogram for malignant neoplasm of breast: Secondary | ICD-10-CM | POA: Diagnosis not present

## 2020-10-27 DIAGNOSIS — Z139 Encounter for screening, unspecified: Secondary | ICD-10-CM | POA: Diagnosis not present

## 2020-10-27 DIAGNOSIS — Z1339 Encounter for screening examination for other mental health and behavioral disorders: Secondary | ICD-10-CM | POA: Diagnosis not present

## 2020-10-27 DIAGNOSIS — Z136 Encounter for screening for cardiovascular disorders: Secondary | ICD-10-CM | POA: Diagnosis not present

## 2020-10-27 DIAGNOSIS — Z0000 Encounter for general adult medical examination without abnormal findings: Secondary | ICD-10-CM | POA: Diagnosis not present

## 2020-10-27 DIAGNOSIS — Z1331 Encounter for screening for depression: Secondary | ICD-10-CM | POA: Diagnosis not present

## 2020-10-27 DIAGNOSIS — E663 Overweight: Secondary | ICD-10-CM | POA: Diagnosis not present

## 2020-10-27 DIAGNOSIS — Z7141 Alcohol abuse counseling and surveillance of alcoholic: Secondary | ICD-10-CM | POA: Diagnosis not present

## 2020-10-28 DIAGNOSIS — L409 Psoriasis, unspecified: Secondary | ICD-10-CM | POA: Diagnosis not present

## 2020-10-28 DIAGNOSIS — M8580 Other specified disorders of bone density and structure, unspecified site: Secondary | ICD-10-CM | POA: Diagnosis not present

## 2020-10-28 DIAGNOSIS — L4050 Arthropathic psoriasis, unspecified: Secondary | ICD-10-CM | POA: Diagnosis not present

## 2020-10-28 DIAGNOSIS — M25511 Pain in right shoulder: Secondary | ICD-10-CM | POA: Diagnosis not present

## 2020-10-28 DIAGNOSIS — E669 Obesity, unspecified: Secondary | ICD-10-CM | POA: Diagnosis not present

## 2020-10-28 DIAGNOSIS — M7581 Other shoulder lesions, right shoulder: Secondary | ICD-10-CM | POA: Diagnosis not present

## 2020-10-28 DIAGNOSIS — M1990 Unspecified osteoarthritis, unspecified site: Secondary | ICD-10-CM | POA: Diagnosis not present

## 2020-10-28 DIAGNOSIS — Z79899 Other long term (current) drug therapy: Secondary | ICD-10-CM | POA: Diagnosis not present

## 2020-10-28 DIAGNOSIS — M791 Myalgia, unspecified site: Secondary | ICD-10-CM | POA: Diagnosis not present

## 2020-10-29 ENCOUNTER — Other Ambulatory Visit: Payer: Self-pay | Admitting: Rheumatology

## 2020-10-29 DIAGNOSIS — M25511 Pain in right shoulder: Secondary | ICD-10-CM

## 2020-11-03 DIAGNOSIS — E1122 Type 2 diabetes mellitus with diabetic chronic kidney disease: Secondary | ICD-10-CM | POA: Diagnosis not present

## 2020-11-03 DIAGNOSIS — M25551 Pain in right hip: Secondary | ICD-10-CM | POA: Diagnosis not present

## 2020-11-03 DIAGNOSIS — I129 Hypertensive chronic kidney disease with stage 1 through stage 4 chronic kidney disease, or unspecified chronic kidney disease: Secondary | ICD-10-CM | POA: Diagnosis not present

## 2020-11-03 DIAGNOSIS — N182 Chronic kidney disease, stage 2 (mild): Secondary | ICD-10-CM | POA: Diagnosis not present

## 2020-11-09 ENCOUNTER — Other Ambulatory Visit: Payer: Medicare PPO

## 2020-11-17 DIAGNOSIS — E039 Hypothyroidism, unspecified: Secondary | ICD-10-CM | POA: Diagnosis not present

## 2020-11-17 DIAGNOSIS — E1122 Type 2 diabetes mellitus with diabetic chronic kidney disease: Secondary | ICD-10-CM | POA: Diagnosis not present

## 2020-11-17 DIAGNOSIS — E785 Hyperlipidemia, unspecified: Secondary | ICD-10-CM | POA: Diagnosis not present

## 2020-11-18 ENCOUNTER — Ambulatory Visit
Admission: RE | Admit: 2020-11-18 | Discharge: 2020-11-18 | Disposition: A | Discharge status: Home / Self Care | Payer: Medicare PPO | Source: Ambulatory Visit | Attending: Rheumatology | Admitting: Rheumatology

## 2020-11-18 DIAGNOSIS — M25511 Pain in right shoulder: Secondary | ICD-10-CM

## 2020-12-05 DIAGNOSIS — N182 Chronic kidney disease, stage 2 (mild): Secondary | ICD-10-CM | POA: Diagnosis not present

## 2020-12-05 DIAGNOSIS — E039 Hypothyroidism, unspecified: Secondary | ICD-10-CM | POA: Diagnosis not present

## 2020-12-05 DIAGNOSIS — I129 Hypertensive chronic kidney disease with stage 1 through stage 4 chronic kidney disease, or unspecified chronic kidney disease: Secondary | ICD-10-CM | POA: Diagnosis not present

## 2020-12-05 DIAGNOSIS — E1122 Type 2 diabetes mellitus with diabetic chronic kidney disease: Secondary | ICD-10-CM | POA: Diagnosis not present

## 2020-12-11 DIAGNOSIS — M47816 Spondylosis without myelopathy or radiculopathy, lumbar region: Secondary | ICD-10-CM | POA: Diagnosis not present

## 2020-12-12 DIAGNOSIS — M25511 Pain in right shoulder: Secondary | ICD-10-CM | POA: Diagnosis not present

## 2021-01-08 DIAGNOSIS — M75101 Unspecified rotator cuff tear or rupture of right shoulder, not specified as traumatic: Secondary | ICD-10-CM | POA: Diagnosis not present

## 2021-01-08 DIAGNOSIS — L4050 Arthropathic psoriasis, unspecified: Secondary | ICD-10-CM | POA: Diagnosis not present

## 2021-01-08 DIAGNOSIS — E1169 Type 2 diabetes mellitus with other specified complication: Secondary | ICD-10-CM | POA: Diagnosis not present

## 2021-01-08 DIAGNOSIS — E782 Mixed hyperlipidemia: Secondary | ICD-10-CM | POA: Diagnosis not present

## 2021-01-09 DIAGNOSIS — M47816 Spondylosis without myelopathy or radiculopathy, lumbar region: Secondary | ICD-10-CM | POA: Diagnosis not present

## 2021-02-02 DIAGNOSIS — M7541 Impingement syndrome of right shoulder: Secondary | ICD-10-CM | POA: Diagnosis not present

## 2021-02-02 DIAGNOSIS — M19011 Primary osteoarthritis, right shoulder: Secondary | ICD-10-CM | POA: Diagnosis not present

## 2021-02-02 DIAGNOSIS — M75121 Complete rotator cuff tear or rupture of right shoulder, not specified as traumatic: Secondary | ICD-10-CM | POA: Diagnosis not present

## 2021-02-02 DIAGNOSIS — M7551 Bursitis of right shoulder: Secondary | ICD-10-CM | POA: Diagnosis not present

## 2021-02-02 DIAGNOSIS — G8918 Other acute postprocedural pain: Secondary | ICD-10-CM | POA: Diagnosis not present

## 2021-02-02 DIAGNOSIS — M7521 Bicipital tendinitis, right shoulder: Secondary | ICD-10-CM | POA: Diagnosis not present

## 2021-02-02 HISTORY — PX: SHOULDER ARTHROSCOPY WITH ROTATOR CUFF REPAIR: SHX5685

## 2021-02-17 DIAGNOSIS — Z4889 Encounter for other specified surgical aftercare: Secondary | ICD-10-CM | POA: Diagnosis not present

## 2021-03-04 DIAGNOSIS — M75101 Unspecified rotator cuff tear or rupture of right shoulder, not specified as traumatic: Secondary | ICD-10-CM | POA: Diagnosis not present

## 2021-03-04 DIAGNOSIS — Z4889 Encounter for other specified surgical aftercare: Secondary | ICD-10-CM | POA: Diagnosis not present

## 2021-03-04 DIAGNOSIS — M25611 Stiffness of right shoulder, not elsewhere classified: Secondary | ICD-10-CM | POA: Diagnosis not present

## 2021-03-04 DIAGNOSIS — M25511 Pain in right shoulder: Secondary | ICD-10-CM | POA: Diagnosis not present

## 2021-03-04 DIAGNOSIS — R531 Weakness: Secondary | ICD-10-CM | POA: Diagnosis not present

## 2021-03-06 DIAGNOSIS — Z4889 Encounter for other specified surgical aftercare: Secondary | ICD-10-CM | POA: Diagnosis not present

## 2021-03-06 DIAGNOSIS — R531 Weakness: Secondary | ICD-10-CM | POA: Diagnosis not present

## 2021-03-06 DIAGNOSIS — M75101 Unspecified rotator cuff tear or rupture of right shoulder, not specified as traumatic: Secondary | ICD-10-CM | POA: Diagnosis not present

## 2021-03-06 DIAGNOSIS — M25511 Pain in right shoulder: Secondary | ICD-10-CM | POA: Diagnosis not present

## 2021-03-06 DIAGNOSIS — M25611 Stiffness of right shoulder, not elsewhere classified: Secondary | ICD-10-CM | POA: Diagnosis not present

## 2021-03-09 DIAGNOSIS — Z4889 Encounter for other specified surgical aftercare: Secondary | ICD-10-CM | POA: Diagnosis not present

## 2021-03-09 DIAGNOSIS — R531 Weakness: Secondary | ICD-10-CM | POA: Diagnosis not present

## 2021-03-09 DIAGNOSIS — M75101 Unspecified rotator cuff tear or rupture of right shoulder, not specified as traumatic: Secondary | ICD-10-CM | POA: Diagnosis not present

## 2021-03-09 DIAGNOSIS — M25511 Pain in right shoulder: Secondary | ICD-10-CM | POA: Diagnosis not present

## 2021-03-09 DIAGNOSIS — M25611 Stiffness of right shoulder, not elsewhere classified: Secondary | ICD-10-CM | POA: Diagnosis not present

## 2021-03-11 DIAGNOSIS — M75101 Unspecified rotator cuff tear or rupture of right shoulder, not specified as traumatic: Secondary | ICD-10-CM | POA: Diagnosis not present

## 2021-03-11 DIAGNOSIS — Z4889 Encounter for other specified surgical aftercare: Secondary | ICD-10-CM | POA: Diagnosis not present

## 2021-03-11 DIAGNOSIS — R531 Weakness: Secondary | ICD-10-CM | POA: Diagnosis not present

## 2021-03-11 DIAGNOSIS — M25511 Pain in right shoulder: Secondary | ICD-10-CM | POA: Diagnosis not present

## 2021-03-11 DIAGNOSIS — M25611 Stiffness of right shoulder, not elsewhere classified: Secondary | ICD-10-CM | POA: Diagnosis not present

## 2021-03-12 DIAGNOSIS — Z79899 Other long term (current) drug therapy: Secondary | ICD-10-CM | POA: Diagnosis not present

## 2021-03-12 DIAGNOSIS — M1990 Unspecified osteoarthritis, unspecified site: Secondary | ICD-10-CM | POA: Diagnosis not present

## 2021-03-12 DIAGNOSIS — Z23 Encounter for immunization: Secondary | ICD-10-CM | POA: Diagnosis not present

## 2021-03-12 DIAGNOSIS — M7581 Other shoulder lesions, right shoulder: Secondary | ICD-10-CM | POA: Diagnosis not present

## 2021-03-12 DIAGNOSIS — L409 Psoriasis, unspecified: Secondary | ICD-10-CM | POA: Diagnosis not present

## 2021-03-12 DIAGNOSIS — L4050 Arthropathic psoriasis, unspecified: Secondary | ICD-10-CM | POA: Diagnosis not present

## 2021-03-12 DIAGNOSIS — M791 Myalgia, unspecified site: Secondary | ICD-10-CM | POA: Diagnosis not present

## 2021-03-12 DIAGNOSIS — M8580 Other specified disorders of bone density and structure, unspecified site: Secondary | ICD-10-CM | POA: Diagnosis not present

## 2021-03-12 DIAGNOSIS — E669 Obesity, unspecified: Secondary | ICD-10-CM | POA: Diagnosis not present

## 2021-03-13 DIAGNOSIS — R531 Weakness: Secondary | ICD-10-CM | POA: Diagnosis not present

## 2021-03-13 DIAGNOSIS — Z4889 Encounter for other specified surgical aftercare: Secondary | ICD-10-CM | POA: Diagnosis not present

## 2021-03-13 DIAGNOSIS — M25511 Pain in right shoulder: Secondary | ICD-10-CM | POA: Diagnosis not present

## 2021-03-13 DIAGNOSIS — M25611 Stiffness of right shoulder, not elsewhere classified: Secondary | ICD-10-CM | POA: Diagnosis not present

## 2021-03-13 DIAGNOSIS — M75101 Unspecified rotator cuff tear or rupture of right shoulder, not specified as traumatic: Secondary | ICD-10-CM | POA: Diagnosis not present

## 2021-03-16 DIAGNOSIS — Z4889 Encounter for other specified surgical aftercare: Secondary | ICD-10-CM | POA: Diagnosis not present

## 2021-03-16 DIAGNOSIS — M25511 Pain in right shoulder: Secondary | ICD-10-CM | POA: Diagnosis not present

## 2021-03-16 DIAGNOSIS — M25611 Stiffness of right shoulder, not elsewhere classified: Secondary | ICD-10-CM | POA: Diagnosis not present

## 2021-03-16 DIAGNOSIS — M75101 Unspecified rotator cuff tear or rupture of right shoulder, not specified as traumatic: Secondary | ICD-10-CM | POA: Diagnosis not present

## 2021-03-16 DIAGNOSIS — R531 Weakness: Secondary | ICD-10-CM | POA: Diagnosis not present

## 2021-03-18 DIAGNOSIS — Z4889 Encounter for other specified surgical aftercare: Secondary | ICD-10-CM | POA: Diagnosis not present

## 2021-03-18 DIAGNOSIS — M75101 Unspecified rotator cuff tear or rupture of right shoulder, not specified as traumatic: Secondary | ICD-10-CM | POA: Diagnosis not present

## 2021-03-18 DIAGNOSIS — R531 Weakness: Secondary | ICD-10-CM | POA: Diagnosis not present

## 2021-03-18 DIAGNOSIS — M25611 Stiffness of right shoulder, not elsewhere classified: Secondary | ICD-10-CM | POA: Diagnosis not present

## 2021-03-18 DIAGNOSIS — M25511 Pain in right shoulder: Secondary | ICD-10-CM | POA: Diagnosis not present

## 2021-03-23 DIAGNOSIS — R531 Weakness: Secondary | ICD-10-CM | POA: Diagnosis not present

## 2021-03-23 DIAGNOSIS — M75101 Unspecified rotator cuff tear or rupture of right shoulder, not specified as traumatic: Secondary | ICD-10-CM | POA: Diagnosis not present

## 2021-03-23 DIAGNOSIS — M25511 Pain in right shoulder: Secondary | ICD-10-CM | POA: Diagnosis not present

## 2021-03-23 DIAGNOSIS — M25611 Stiffness of right shoulder, not elsewhere classified: Secondary | ICD-10-CM | POA: Diagnosis not present

## 2021-03-23 DIAGNOSIS — Z4889 Encounter for other specified surgical aftercare: Secondary | ICD-10-CM | POA: Diagnosis not present

## 2021-03-25 DIAGNOSIS — M25611 Stiffness of right shoulder, not elsewhere classified: Secondary | ICD-10-CM | POA: Diagnosis not present

## 2021-03-25 DIAGNOSIS — M75101 Unspecified rotator cuff tear or rupture of right shoulder, not specified as traumatic: Secondary | ICD-10-CM | POA: Diagnosis not present

## 2021-03-25 DIAGNOSIS — R531 Weakness: Secondary | ICD-10-CM | POA: Diagnosis not present

## 2021-03-25 DIAGNOSIS — M25511 Pain in right shoulder: Secondary | ICD-10-CM | POA: Diagnosis not present

## 2021-03-25 DIAGNOSIS — Z4889 Encounter for other specified surgical aftercare: Secondary | ICD-10-CM | POA: Diagnosis not present

## 2021-03-27 DIAGNOSIS — Z4889 Encounter for other specified surgical aftercare: Secondary | ICD-10-CM | POA: Diagnosis not present

## 2021-03-27 DIAGNOSIS — M25511 Pain in right shoulder: Secondary | ICD-10-CM | POA: Diagnosis not present

## 2021-03-27 DIAGNOSIS — R531 Weakness: Secondary | ICD-10-CM | POA: Diagnosis not present

## 2021-03-27 DIAGNOSIS — M25611 Stiffness of right shoulder, not elsewhere classified: Secondary | ICD-10-CM | POA: Diagnosis not present

## 2021-03-27 DIAGNOSIS — M75101 Unspecified rotator cuff tear or rupture of right shoulder, not specified as traumatic: Secondary | ICD-10-CM | POA: Diagnosis not present

## 2021-03-30 DIAGNOSIS — Z4889 Encounter for other specified surgical aftercare: Secondary | ICD-10-CM | POA: Diagnosis not present

## 2021-03-30 DIAGNOSIS — M75101 Unspecified rotator cuff tear or rupture of right shoulder, not specified as traumatic: Secondary | ICD-10-CM | POA: Diagnosis not present

## 2021-03-30 DIAGNOSIS — M25511 Pain in right shoulder: Secondary | ICD-10-CM | POA: Diagnosis not present

## 2021-03-30 DIAGNOSIS — M25611 Stiffness of right shoulder, not elsewhere classified: Secondary | ICD-10-CM | POA: Diagnosis not present

## 2021-03-30 DIAGNOSIS — R531 Weakness: Secondary | ICD-10-CM | POA: Diagnosis not present

## 2021-04-01 DIAGNOSIS — R531 Weakness: Secondary | ICD-10-CM | POA: Diagnosis not present

## 2021-04-01 DIAGNOSIS — M25611 Stiffness of right shoulder, not elsewhere classified: Secondary | ICD-10-CM | POA: Diagnosis not present

## 2021-04-01 DIAGNOSIS — M25511 Pain in right shoulder: Secondary | ICD-10-CM | POA: Diagnosis not present

## 2021-04-01 DIAGNOSIS — M75101 Unspecified rotator cuff tear or rupture of right shoulder, not specified as traumatic: Secondary | ICD-10-CM | POA: Diagnosis not present

## 2021-04-01 DIAGNOSIS — Z4889 Encounter for other specified surgical aftercare: Secondary | ICD-10-CM | POA: Diagnosis not present

## 2021-04-06 DIAGNOSIS — M75101 Unspecified rotator cuff tear or rupture of right shoulder, not specified as traumatic: Secondary | ICD-10-CM | POA: Diagnosis not present

## 2021-04-06 DIAGNOSIS — Z4889 Encounter for other specified surgical aftercare: Secondary | ICD-10-CM | POA: Diagnosis not present

## 2021-04-06 DIAGNOSIS — R531 Weakness: Secondary | ICD-10-CM | POA: Diagnosis not present

## 2021-04-06 DIAGNOSIS — M25511 Pain in right shoulder: Secondary | ICD-10-CM | POA: Diagnosis not present

## 2021-04-06 DIAGNOSIS — M25611 Stiffness of right shoulder, not elsewhere classified: Secondary | ICD-10-CM | POA: Diagnosis not present

## 2021-04-08 DIAGNOSIS — Z4889 Encounter for other specified surgical aftercare: Secondary | ICD-10-CM | POA: Diagnosis not present

## 2021-04-08 DIAGNOSIS — M25511 Pain in right shoulder: Secondary | ICD-10-CM | POA: Diagnosis not present

## 2021-04-08 DIAGNOSIS — M25611 Stiffness of right shoulder, not elsewhere classified: Secondary | ICD-10-CM | POA: Diagnosis not present

## 2021-04-08 DIAGNOSIS — R531 Weakness: Secondary | ICD-10-CM | POA: Diagnosis not present

## 2021-04-08 DIAGNOSIS — M75101 Unspecified rotator cuff tear or rupture of right shoulder, not specified as traumatic: Secondary | ICD-10-CM | POA: Diagnosis not present

## 2021-04-10 DIAGNOSIS — E039 Hypothyroidism, unspecified: Secondary | ICD-10-CM | POA: Diagnosis not present

## 2021-04-10 DIAGNOSIS — E1169 Type 2 diabetes mellitus with other specified complication: Secondary | ICD-10-CM | POA: Diagnosis not present

## 2021-04-13 DIAGNOSIS — M25511 Pain in right shoulder: Secondary | ICD-10-CM | POA: Diagnosis not present

## 2021-04-13 DIAGNOSIS — M25611 Stiffness of right shoulder, not elsewhere classified: Secondary | ICD-10-CM | POA: Diagnosis not present

## 2021-04-13 DIAGNOSIS — Z4889 Encounter for other specified surgical aftercare: Secondary | ICD-10-CM | POA: Diagnosis not present

## 2021-04-13 DIAGNOSIS — M75101 Unspecified rotator cuff tear or rupture of right shoulder, not specified as traumatic: Secondary | ICD-10-CM | POA: Diagnosis not present

## 2021-04-13 DIAGNOSIS — R531 Weakness: Secondary | ICD-10-CM | POA: Diagnosis not present

## 2021-04-15 DIAGNOSIS — M75101 Unspecified rotator cuff tear or rupture of right shoulder, not specified as traumatic: Secondary | ICD-10-CM | POA: Diagnosis not present

## 2021-04-15 DIAGNOSIS — M25611 Stiffness of right shoulder, not elsewhere classified: Secondary | ICD-10-CM | POA: Diagnosis not present

## 2021-04-15 DIAGNOSIS — M25511 Pain in right shoulder: Secondary | ICD-10-CM | POA: Diagnosis not present

## 2021-04-15 DIAGNOSIS — Z4889 Encounter for other specified surgical aftercare: Secondary | ICD-10-CM | POA: Diagnosis not present

## 2021-04-15 DIAGNOSIS — R531 Weakness: Secondary | ICD-10-CM | POA: Diagnosis not present

## 2021-04-17 DIAGNOSIS — Z4889 Encounter for other specified surgical aftercare: Secondary | ICD-10-CM | POA: Diagnosis not present

## 2021-04-17 DIAGNOSIS — M25511 Pain in right shoulder: Secondary | ICD-10-CM | POA: Diagnosis not present

## 2021-04-20 DIAGNOSIS — E039 Hypothyroidism, unspecified: Secondary | ICD-10-CM | POA: Diagnosis not present

## 2021-04-20 DIAGNOSIS — I129 Hypertensive chronic kidney disease with stage 1 through stage 4 chronic kidney disease, or unspecified chronic kidney disease: Secondary | ICD-10-CM | POA: Diagnosis not present

## 2021-04-20 DIAGNOSIS — E1122 Type 2 diabetes mellitus with diabetic chronic kidney disease: Secondary | ICD-10-CM | POA: Diagnosis not present

## 2021-04-20 DIAGNOSIS — N182 Chronic kidney disease, stage 2 (mild): Secondary | ICD-10-CM | POA: Diagnosis not present

## 2021-04-21 DIAGNOSIS — M75101 Unspecified rotator cuff tear or rupture of right shoulder, not specified as traumatic: Secondary | ICD-10-CM | POA: Diagnosis not present

## 2021-04-21 DIAGNOSIS — M25511 Pain in right shoulder: Secondary | ICD-10-CM | POA: Diagnosis not present

## 2021-04-21 DIAGNOSIS — M25611 Stiffness of right shoulder, not elsewhere classified: Secondary | ICD-10-CM | POA: Diagnosis not present

## 2021-04-21 DIAGNOSIS — R531 Weakness: Secondary | ICD-10-CM | POA: Diagnosis not present

## 2021-04-21 DIAGNOSIS — Z4889 Encounter for other specified surgical aftercare: Secondary | ICD-10-CM | POA: Diagnosis not present

## 2021-04-28 DIAGNOSIS — M75101 Unspecified rotator cuff tear or rupture of right shoulder, not specified as traumatic: Secondary | ICD-10-CM | POA: Diagnosis not present

## 2021-04-28 DIAGNOSIS — M25511 Pain in right shoulder: Secondary | ICD-10-CM | POA: Diagnosis not present

## 2021-04-28 DIAGNOSIS — R531 Weakness: Secondary | ICD-10-CM | POA: Diagnosis not present

## 2021-04-28 DIAGNOSIS — Z4889 Encounter for other specified surgical aftercare: Secondary | ICD-10-CM | POA: Diagnosis not present

## 2021-04-28 DIAGNOSIS — M25611 Stiffness of right shoulder, not elsewhere classified: Secondary | ICD-10-CM | POA: Diagnosis not present

## 2021-05-01 DIAGNOSIS — Z4889 Encounter for other specified surgical aftercare: Secondary | ICD-10-CM | POA: Diagnosis not present

## 2021-05-01 DIAGNOSIS — R531 Weakness: Secondary | ICD-10-CM | POA: Diagnosis not present

## 2021-05-01 DIAGNOSIS — M75101 Unspecified rotator cuff tear or rupture of right shoulder, not specified as traumatic: Secondary | ICD-10-CM | POA: Diagnosis not present

## 2021-05-01 DIAGNOSIS — M25511 Pain in right shoulder: Secondary | ICD-10-CM | POA: Diagnosis not present

## 2021-05-01 DIAGNOSIS — M25611 Stiffness of right shoulder, not elsewhere classified: Secondary | ICD-10-CM | POA: Diagnosis not present

## 2021-05-11 DIAGNOSIS — Z20822 Contact with and (suspected) exposure to covid-19: Secondary | ICD-10-CM | POA: Diagnosis not present

## 2021-05-11 DIAGNOSIS — Z1152 Encounter for screening for COVID-19: Secondary | ICD-10-CM | POA: Diagnosis not present

## 2021-05-11 DIAGNOSIS — J020 Streptococcal pharyngitis: Secondary | ICD-10-CM | POA: Diagnosis not present

## 2021-05-20 DIAGNOSIS — M25611 Stiffness of right shoulder, not elsewhere classified: Secondary | ICD-10-CM | POA: Diagnosis not present

## 2021-05-20 DIAGNOSIS — M25511 Pain in right shoulder: Secondary | ICD-10-CM | POA: Diagnosis not present

## 2021-05-20 DIAGNOSIS — Z4889 Encounter for other specified surgical aftercare: Secondary | ICD-10-CM | POA: Diagnosis not present

## 2021-05-20 DIAGNOSIS — R531 Weakness: Secondary | ICD-10-CM | POA: Diagnosis not present

## 2021-05-20 DIAGNOSIS — M75101 Unspecified rotator cuff tear or rupture of right shoulder, not specified as traumatic: Secondary | ICD-10-CM | POA: Diagnosis not present

## 2021-06-09 DIAGNOSIS — M25511 Pain in right shoulder: Secondary | ICD-10-CM | POA: Diagnosis not present

## 2021-06-09 DIAGNOSIS — M75101 Unspecified rotator cuff tear or rupture of right shoulder, not specified as traumatic: Secondary | ICD-10-CM | POA: Diagnosis not present

## 2021-06-09 DIAGNOSIS — M25611 Stiffness of right shoulder, not elsewhere classified: Secondary | ICD-10-CM | POA: Diagnosis not present

## 2021-06-09 DIAGNOSIS — Z4889 Encounter for other specified surgical aftercare: Secondary | ICD-10-CM | POA: Diagnosis not present

## 2021-06-09 DIAGNOSIS — R531 Weakness: Secondary | ICD-10-CM | POA: Diagnosis not present

## 2021-06-17 DIAGNOSIS — L4050 Arthropathic psoriasis, unspecified: Secondary | ICD-10-CM | POA: Diagnosis not present

## 2021-06-17 DIAGNOSIS — M25511 Pain in right shoulder: Secondary | ICD-10-CM | POA: Diagnosis not present

## 2021-06-17 DIAGNOSIS — M8580 Other specified disorders of bone density and structure, unspecified site: Secondary | ICD-10-CM | POA: Diagnosis not present

## 2021-06-17 DIAGNOSIS — M1990 Unspecified osteoarthritis, unspecified site: Secondary | ICD-10-CM | POA: Diagnosis not present

## 2021-06-17 DIAGNOSIS — M7581 Other shoulder lesions, right shoulder: Secondary | ICD-10-CM | POA: Diagnosis not present

## 2021-06-17 DIAGNOSIS — M791 Myalgia, unspecified site: Secondary | ICD-10-CM | POA: Diagnosis not present

## 2021-06-17 DIAGNOSIS — E669 Obesity, unspecified: Secondary | ICD-10-CM | POA: Diagnosis not present

## 2021-06-17 DIAGNOSIS — L409 Psoriasis, unspecified: Secondary | ICD-10-CM | POA: Diagnosis not present

## 2021-06-17 DIAGNOSIS — Z79899 Other long term (current) drug therapy: Secondary | ICD-10-CM | POA: Diagnosis not present

## 2021-06-25 DIAGNOSIS — R002 Palpitations: Secondary | ICD-10-CM | POA: Diagnosis not present

## 2021-06-25 DIAGNOSIS — E1122 Type 2 diabetes mellitus with diabetic chronic kidney disease: Secondary | ICD-10-CM | POA: Diagnosis not present

## 2021-06-25 DIAGNOSIS — N182 Chronic kidney disease, stage 2 (mild): Secondary | ICD-10-CM | POA: Diagnosis not present

## 2021-06-25 DIAGNOSIS — I129 Hypertensive chronic kidney disease with stage 1 through stage 4 chronic kidney disease, or unspecified chronic kidney disease: Secondary | ICD-10-CM | POA: Diagnosis not present

## 2021-07-02 DIAGNOSIS — E119 Type 2 diabetes mellitus without complications: Secondary | ICD-10-CM | POA: Diagnosis not present

## 2021-07-11 DIAGNOSIS — E039 Hypothyroidism, unspecified: Secondary | ICD-10-CM | POA: Diagnosis not present

## 2021-07-11 DIAGNOSIS — E785 Hyperlipidemia, unspecified: Secondary | ICD-10-CM | POA: Diagnosis not present

## 2021-07-11 DIAGNOSIS — Z79899 Other long term (current) drug therapy: Secondary | ICD-10-CM | POA: Diagnosis not present

## 2021-07-11 DIAGNOSIS — L4050 Arthropathic psoriasis, unspecified: Secondary | ICD-10-CM | POA: Diagnosis not present

## 2021-07-11 DIAGNOSIS — I361 Nonrheumatic tricuspid (valve) insufficiency: Secondary | ICD-10-CM | POA: Diagnosis not present

## 2021-07-11 DIAGNOSIS — I10 Essential (primary) hypertension: Secondary | ICD-10-CM | POA: Diagnosis not present

## 2021-07-11 DIAGNOSIS — R002 Palpitations: Secondary | ICD-10-CM | POA: Diagnosis not present

## 2021-07-11 DIAGNOSIS — R079 Chest pain, unspecified: Secondary | ICD-10-CM | POA: Diagnosis not present

## 2021-07-11 DIAGNOSIS — I2510 Atherosclerotic heart disease of native coronary artery without angina pectoris: Secondary | ICD-10-CM | POA: Diagnosis not present

## 2021-07-14 DIAGNOSIS — I4729 Other ventricular tachycardia: Secondary | ICD-10-CM | POA: Diagnosis not present

## 2021-07-17 DIAGNOSIS — F339 Major depressive disorder, recurrent, unspecified: Secondary | ICD-10-CM | POA: Diagnosis not present

## 2021-07-17 DIAGNOSIS — R002 Palpitations: Secondary | ICD-10-CM | POA: Diagnosis not present

## 2021-07-17 DIAGNOSIS — I472 Ventricular tachycardia, unspecified: Secondary | ICD-10-CM | POA: Diagnosis not present

## 2021-07-17 DIAGNOSIS — I471 Supraventricular tachycardia: Secondary | ICD-10-CM | POA: Diagnosis not present

## 2021-08-10 DIAGNOSIS — M47816 Spondylosis without myelopathy or radiculopathy, lumbar region: Secondary | ICD-10-CM | POA: Diagnosis not present

## 2021-08-10 DIAGNOSIS — M5416 Radiculopathy, lumbar region: Secondary | ICD-10-CM | POA: Diagnosis not present

## 2021-08-18 DIAGNOSIS — M5416 Radiculopathy, lumbar region: Secondary | ICD-10-CM | POA: Diagnosis not present

## 2021-08-20 DIAGNOSIS — F339 Major depressive disorder, recurrent, unspecified: Secondary | ICD-10-CM | POA: Diagnosis not present

## 2021-08-20 DIAGNOSIS — G8929 Other chronic pain: Secondary | ICD-10-CM | POA: Diagnosis not present

## 2021-08-20 DIAGNOSIS — M1990 Unspecified osteoarthritis, unspecified site: Secondary | ICD-10-CM | POA: Diagnosis not present

## 2021-08-20 DIAGNOSIS — I471 Supraventricular tachycardia: Secondary | ICD-10-CM | POA: Diagnosis not present

## 2021-08-24 DIAGNOSIS — Z79899 Other long term (current) drug therapy: Secondary | ICD-10-CM | POA: Diagnosis not present

## 2021-08-25 DIAGNOSIS — E1169 Type 2 diabetes mellitus with other specified complication: Secondary | ICD-10-CM | POA: Diagnosis not present

## 2021-08-25 DIAGNOSIS — E1122 Type 2 diabetes mellitus with diabetic chronic kidney disease: Secondary | ICD-10-CM | POA: Diagnosis not present

## 2021-08-31 DIAGNOSIS — E1169 Type 2 diabetes mellitus with other specified complication: Secondary | ICD-10-CM | POA: Diagnosis not present

## 2021-08-31 DIAGNOSIS — E782 Mixed hyperlipidemia: Secondary | ICD-10-CM | POA: Diagnosis not present

## 2021-08-31 DIAGNOSIS — E785 Hyperlipidemia, unspecified: Secondary | ICD-10-CM | POA: Diagnosis not present

## 2021-08-31 DIAGNOSIS — F339 Major depressive disorder, recurrent, unspecified: Secondary | ICD-10-CM | POA: Diagnosis not present

## 2021-09-04 DIAGNOSIS — R262 Difficulty in walking, not elsewhere classified: Secondary | ICD-10-CM | POA: Diagnosis not present

## 2021-09-04 DIAGNOSIS — R531 Weakness: Secondary | ICD-10-CM | POA: Diagnosis not present

## 2021-09-04 DIAGNOSIS — M7072 Other bursitis of hip, left hip: Secondary | ICD-10-CM | POA: Diagnosis not present

## 2021-09-07 DIAGNOSIS — M5416 Radiculopathy, lumbar region: Secondary | ICD-10-CM | POA: Diagnosis not present

## 2021-09-09 DIAGNOSIS — M7072 Other bursitis of hip, left hip: Secondary | ICD-10-CM | POA: Diagnosis not present

## 2021-09-09 DIAGNOSIS — R262 Difficulty in walking, not elsewhere classified: Secondary | ICD-10-CM | POA: Diagnosis not present

## 2021-09-09 DIAGNOSIS — R531 Weakness: Secondary | ICD-10-CM | POA: Diagnosis not present

## 2021-09-11 DIAGNOSIS — R531 Weakness: Secondary | ICD-10-CM | POA: Diagnosis not present

## 2021-09-11 DIAGNOSIS — R262 Difficulty in walking, not elsewhere classified: Secondary | ICD-10-CM | POA: Diagnosis not present

## 2021-09-11 DIAGNOSIS — M7072 Other bursitis of hip, left hip: Secondary | ICD-10-CM | POA: Diagnosis not present

## 2021-09-12 DIAGNOSIS — E1122 Type 2 diabetes mellitus with diabetic chronic kidney disease: Secondary | ICD-10-CM | POA: Diagnosis not present

## 2021-09-12 DIAGNOSIS — E669 Obesity, unspecified: Secondary | ICD-10-CM | POA: Diagnosis not present

## 2021-09-15 DIAGNOSIS — R531 Weakness: Secondary | ICD-10-CM | POA: Diagnosis not present

## 2021-09-15 DIAGNOSIS — R262 Difficulty in walking, not elsewhere classified: Secondary | ICD-10-CM | POA: Diagnosis not present

## 2021-09-15 DIAGNOSIS — M7072 Other bursitis of hip, left hip: Secondary | ICD-10-CM | POA: Diagnosis not present

## 2021-09-16 DIAGNOSIS — M8580 Other specified disorders of bone density and structure, unspecified site: Secondary | ICD-10-CM | POA: Diagnosis not present

## 2021-09-16 DIAGNOSIS — Z79899 Other long term (current) drug therapy: Secondary | ICD-10-CM | POA: Diagnosis not present

## 2021-09-16 DIAGNOSIS — E669 Obesity, unspecified: Secondary | ICD-10-CM | POA: Diagnosis not present

## 2021-09-16 DIAGNOSIS — M7989 Other specified soft tissue disorders: Secondary | ICD-10-CM | POA: Diagnosis not present

## 2021-09-16 DIAGNOSIS — M79676 Pain in unspecified toe(s): Secondary | ICD-10-CM | POA: Diagnosis not present

## 2021-09-16 DIAGNOSIS — L4050 Arthropathic psoriasis, unspecified: Secondary | ICD-10-CM | POA: Diagnosis not present

## 2021-09-16 DIAGNOSIS — L409 Psoriasis, unspecified: Secondary | ICD-10-CM | POA: Diagnosis not present

## 2021-09-16 DIAGNOSIS — M1990 Unspecified osteoarthritis, unspecified site: Secondary | ICD-10-CM | POA: Diagnosis not present

## 2021-09-17 DIAGNOSIS — R531 Weakness: Secondary | ICD-10-CM | POA: Diagnosis not present

## 2021-09-17 DIAGNOSIS — M7072 Other bursitis of hip, left hip: Secondary | ICD-10-CM | POA: Diagnosis not present

## 2021-09-17 DIAGNOSIS — R262 Difficulty in walking, not elsewhere classified: Secondary | ICD-10-CM | POA: Diagnosis not present

## 2021-09-24 DIAGNOSIS — R531 Weakness: Secondary | ICD-10-CM | POA: Diagnosis not present

## 2021-09-24 DIAGNOSIS — R262 Difficulty in walking, not elsewhere classified: Secondary | ICD-10-CM | POA: Diagnosis not present

## 2021-09-24 DIAGNOSIS — M7072 Other bursitis of hip, left hip: Secondary | ICD-10-CM | POA: Diagnosis not present

## 2021-10-02 DIAGNOSIS — R262 Difficulty in walking, not elsewhere classified: Secondary | ICD-10-CM | POA: Diagnosis not present

## 2021-10-02 DIAGNOSIS — M7072 Other bursitis of hip, left hip: Secondary | ICD-10-CM | POA: Diagnosis not present

## 2021-10-02 DIAGNOSIS — R531 Weakness: Secondary | ICD-10-CM | POA: Diagnosis not present

## 2021-10-12 DIAGNOSIS — E1122 Type 2 diabetes mellitus with diabetic chronic kidney disease: Secondary | ICD-10-CM | POA: Diagnosis not present

## 2021-10-12 DIAGNOSIS — E669 Obesity, unspecified: Secondary | ICD-10-CM | POA: Diagnosis not present

## 2021-10-14 DIAGNOSIS — Z1231 Encounter for screening mammogram for malignant neoplasm of breast: Secondary | ICD-10-CM | POA: Diagnosis not present

## 2021-10-14 DIAGNOSIS — L4050 Arthropathic psoriasis, unspecified: Secondary | ICD-10-CM | POA: Diagnosis not present

## 2021-10-22 ENCOUNTER — Inpatient Hospital Stay (HOSPITAL_COMMUNITY)
Admission: EM | Admit: 2021-10-22 | Discharge: 2021-10-24 | DRG: 522 | Disposition: A | Discharge status: Home / Self Care | Payer: Medicare PPO | Attending: Internal Medicine | Admitting: Internal Medicine

## 2021-10-22 ENCOUNTER — Other Ambulatory Visit: Payer: Self-pay

## 2021-10-22 ENCOUNTER — Emergency Department (HOSPITAL_COMMUNITY): Payer: Medicare PPO

## 2021-10-22 ENCOUNTER — Encounter (HOSPITAL_COMMUNITY): Payer: Self-pay

## 2021-10-22 DIAGNOSIS — Z79899 Other long term (current) drug therapy: Secondary | ICD-10-CM | POA: Diagnosis not present

## 2021-10-22 DIAGNOSIS — Z7901 Long term (current) use of anticoagulants: Secondary | ICD-10-CM | POA: Diagnosis not present

## 2021-10-22 DIAGNOSIS — I10 Essential (primary) hypertension: Secondary | ICD-10-CM | POA: Diagnosis present

## 2021-10-22 DIAGNOSIS — Z01818 Encounter for other preprocedural examination: Secondary | ICD-10-CM | POA: Diagnosis not present

## 2021-10-22 DIAGNOSIS — E559 Vitamin D deficiency, unspecified: Secondary | ICD-10-CM | POA: Diagnosis present

## 2021-10-22 DIAGNOSIS — E1165 Type 2 diabetes mellitus with hyperglycemia: Secondary | ICD-10-CM | POA: Diagnosis not present

## 2021-10-22 DIAGNOSIS — E785 Hyperlipidemia, unspecified: Secondary | ICD-10-CM | POA: Diagnosis present

## 2021-10-22 DIAGNOSIS — S72002A Fracture of unspecified part of neck of left femur, initial encounter for closed fracture: Secondary | ICD-10-CM | POA: Diagnosis not present

## 2021-10-22 DIAGNOSIS — D62 Acute posthemorrhagic anemia: Secondary | ICD-10-CM | POA: Diagnosis not present

## 2021-10-22 DIAGNOSIS — S72012A Unspecified intracapsular fracture of left femur, initial encounter for closed fracture: Secondary | ICD-10-CM | POA: Diagnosis present

## 2021-10-22 DIAGNOSIS — L4050 Arthropathic psoriasis, unspecified: Secondary | ICD-10-CM | POA: Diagnosis present

## 2021-10-22 DIAGNOSIS — Z96642 Presence of left artificial hip joint: Secondary | ICD-10-CM | POA: Diagnosis not present

## 2021-10-22 DIAGNOSIS — Z79890 Hormone replacement therapy (postmenopausal): Secondary | ICD-10-CM

## 2021-10-22 DIAGNOSIS — D6859 Other primary thrombophilia: Secondary | ICD-10-CM | POA: Diagnosis present

## 2021-10-22 DIAGNOSIS — E669 Obesity, unspecified: Secondary | ICD-10-CM | POA: Diagnosis present

## 2021-10-22 DIAGNOSIS — W010XXA Fall on same level from slipping, tripping and stumbling without subsequent striking against object, initial encounter: Secondary | ICD-10-CM | POA: Diagnosis present

## 2021-10-22 DIAGNOSIS — M25462 Effusion, left knee: Secondary | ICD-10-CM | POA: Diagnosis not present

## 2021-10-22 DIAGNOSIS — Z86711 Personal history of pulmonary embolism: Secondary | ICD-10-CM | POA: Diagnosis not present

## 2021-10-22 DIAGNOSIS — M25752 Osteophyte, left hip: Secondary | ICD-10-CM | POA: Diagnosis present

## 2021-10-22 DIAGNOSIS — Z6827 Body mass index (BMI) 27.0-27.9, adult: Secondary | ICD-10-CM

## 2021-10-22 DIAGNOSIS — I2510 Atherosclerotic heart disease of native coronary artery without angina pectoris: Secondary | ICD-10-CM | POA: Diagnosis present

## 2021-10-22 DIAGNOSIS — Z471 Aftercare following joint replacement surgery: Secondary | ICD-10-CM | POA: Diagnosis not present

## 2021-10-22 DIAGNOSIS — M25552 Pain in left hip: Secondary | ICD-10-CM | POA: Diagnosis not present

## 2021-10-22 DIAGNOSIS — S72009A Fracture of unspecified part of neck of unspecified femur, initial encounter for closed fracture: Secondary | ICD-10-CM | POA: Diagnosis present

## 2021-10-22 DIAGNOSIS — S79912A Unspecified injury of left hip, initial encounter: Secondary | ICD-10-CM | POA: Diagnosis not present

## 2021-10-22 DIAGNOSIS — D696 Thrombocytopenia, unspecified: Secondary | ICD-10-CM | POA: Diagnosis not present

## 2021-10-22 DIAGNOSIS — G8911 Acute pain due to trauma: Secondary | ICD-10-CM | POA: Diagnosis not present

## 2021-10-22 HISTORY — DX: Atherosclerotic heart disease of native coronary artery without angina pectoris: I25.10

## 2021-10-22 HISTORY — DX: Hyperlipidemia, unspecified: E78.5

## 2021-10-22 HISTORY — DX: Essential (primary) hypertension: I10

## 2021-10-22 HISTORY — DX: Arthropathic psoriasis, unspecified: L40.50

## 2021-10-22 LAB — CBC WITH DIFFERENTIAL/PLATELET
Abs Immature Granulocytes: 0.03 10*3/uL (ref 0.00–0.07)
Basophils Absolute: 0.1 10*3/uL (ref 0.0–0.1)
Basophils Relative: 1 %
Eosinophils Absolute: 0.3 10*3/uL (ref 0.0–0.5)
Eosinophils Relative: 6 %
HCT: 42.7 % (ref 36.0–46.0)
Hemoglobin: 13.8 g/dL (ref 12.0–15.0)
Immature Granulocytes: 1 %
Lymphocytes Relative: 26 %
Lymphs Abs: 1.3 10*3/uL (ref 0.7–4.0)
MCH: 30.7 pg (ref 26.0–34.0)
MCHC: 32.3 g/dL (ref 30.0–36.0)
MCV: 95.1 fL (ref 80.0–100.0)
Monocytes Absolute: 0.5 10*3/uL (ref 0.1–1.0)
Monocytes Relative: 10 %
Neutro Abs: 2.8 10*3/uL (ref 1.7–7.7)
Neutrophils Relative %: 56 %
Platelets: 151 10*3/uL (ref 150–400)
RBC: 4.49 MIL/uL (ref 3.87–5.11)
RDW: 13.1 % (ref 11.5–15.5)
WBC: 5 10*3/uL (ref 4.0–10.5)
nRBC: 0 % (ref 0.0–0.2)

## 2021-10-22 LAB — BASIC METABOLIC PANEL
Anion gap: 9 (ref 5–15)
BUN: 14 mg/dL (ref 8–23)
CO2: 22 mmol/L (ref 22–32)
Calcium: 9.3 mg/dL (ref 8.9–10.3)
Chloride: 112 mmol/L — ABNORMAL HIGH (ref 98–111)
Creatinine, Ser: 0.74 mg/dL (ref 0.44–1.00)
GFR, Estimated: >60 mL/min (ref >60)
Glucose, Bld: 74 mg/dL (ref 70–99)
Potassium: 3.8 mmol/L (ref 3.5–5.1)
Sodium: 143 mmol/L (ref 135–145)

## 2021-10-22 LAB — TYPE AND SCREEN
ABO/RH(D): B POS
Antibody Screen: NEGATIVE

## 2021-10-22 LAB — PROTIME-INR
INR: 1.3 — ABNORMAL HIGH (ref 0.8–1.2)
Prothrombin Time: 15.7 seconds — ABNORMAL HIGH (ref 11.4–15.2)

## 2021-10-22 MED ORDER — MORPHINE SULFATE (PF) 4 MG/ML IV SOLN
4.0000 mg | INTRAVENOUS | Status: DC | PRN
  Administered 2021-10-23 (×2): 4 mg via INTRAVENOUS
  Filled 2021-10-22 (×2): qty 1

## 2021-10-22 MED ORDER — SODIUM CHLORIDE 0.9 % IV SOLN: INTRAVENOUS | Status: DC

## 2021-10-22 MED ORDER — FENTANYL CITRATE PF 50 MCG/ML IJ SOSY
50.0000 ug | PREFILLED_SYRINGE | INTRAMUSCULAR | Status: DC | PRN
  Administered 2021-10-22: 50 ug via INTRAVENOUS
  Filled 2021-10-22: qty 1

## 2021-10-22 MED ORDER — ONDANSETRON HCL 4 MG/2ML IJ SOLN
4.0000 mg | Freq: Once | INTRAMUSCULAR | Status: AC
  Administered 2021-10-22: 4 mg via INTRAVENOUS
  Filled 2021-10-22: qty 2

## 2021-10-22 NOTE — ED Triage Notes (Signed)
Pt BIB [facility] from home after tripping over a box and falling. Patient c/o of left hip pain. A&Ox4. VS stable en route. 40mg  ketamine administered by EMS. ?

## 2021-10-22 NOTE — ED Provider Notes (Addendum)
?[facility] EMERGENCY DEPARTMENT ?Provider Note ? ? ?CSN: [id number] ?Arrival date & time: 10/22/21  2150 ? ?  ? ?History ? ?Chief Complaint  ?Patient presents with  ? Fall  ? ? ?Ann Lester is a 76 y.o. female. ? 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  Patient denies hitting her head or loss of consciousness.  She was walking and tripped over something she left in her hallway.  She fell landing on her left hip and knee.  She reports as soon as it happened it was severe pain and she was unable to move the leg.  She reports her knee hurts slightly but most of the pain is coming from her hip.  She denies any numbness or tingling in the foot.  She has no abdominal pain, chest pain or shortness of breath.  She did receive ketamine by EMS reports that did help some with the pain but it anytime it moves it is very severe.  She has had surgery of both knees but denies any surgery of her hip. ? ?The history is provided by the patient.  ?Fall ? ? ?  ? ?Home Medications ?Prior to Admission medications   ?Medication Sig Start Date End Date Taking? Authorizing Provider  ?gabapentin (NEURONTIN) 300 MG capsule Take 300 mg by mouth 2 (two) times daily. 07/20/21   [provider]  ?leflunomide (ARAVA) 20 MG tablet Take 20 mg by mouth daily. 09/16/21   [provider]  ?levothyroxine (SYNTHROID) 88 MCG tablet Take 88 mcg by mouth daily. 10/08/21   [provider]  ?loratadine (CLARITIN) 10 MG tablet Take 10 mg by mouth daily. 06/16/21   [provider]  ?metoprolol tartrate (LOPRESSOR) 25 MG tablet Take 25 mg by mouth daily. 10/05/21   [provider]  ?verapamil (CALAN-SR) 240 MG CR tablet Take 240 mg by mouth daily. 10/12/21   [provider]  ?XARELTO 20 MG TABS tablet Take 20 mg by mouth every evening. 08/20/21    [provider]  ?   ? ?Allergies    ?Patient has no known allergies.   ? ?Review of Systems   ?Review of Systems ? ?Physical Exam ?Updated Vital Signs ?BP 119/72   Pulse 73   Temp 97.9 ?F (36.6 ?C) (Oral)   Resp 16   Ht 5\' 5"  (1.651 m)   Wt 75.8 kg   SpO2 91%   BMI 27.79 kg/m?  ?Physical Exam ?Vitals and nursing note reviewed.  ?Constitutional:   ?   General: She is not in acute distress. ?   Appearance: She is well-developed.  ?HENT:  ?   Head: Normocephalic and atraumatic.  ?Eyes:  ?   Pupils: Pupils are equal, round, and reactive to light.  ?Cardiovascular:  ?   Rate and Rhythm: Normal rate and regular rhythm.  ?   Heart sounds: Normal heart sounds. No murmur heard. ?  No friction rub.  ?Pulmonary:  ?   Effort: Pulmonary effort is normal.  ?   Breath sounds: Normal breath sounds. No wheezing or rales.  ?Abdominal:  ?   General: Bowel sounds are normal. There is no distension.  ?   Palpations: Abdomen is soft.  ?   Tenderness: There is no abdominal tenderness. There is no guarding or rebound.  ?Musculoskeletal:     ?  General: Tenderness, deformity and signs of injury present.  ?   Cervical back: Normal range of motion and neck supple. No tenderness.  ?   Left hip: Deformity, tenderness and bony tenderness present. Decreased range of motion.  ?   Left knee: Tenderness present over the patellar tendon.  ?     Legs: ? ?   Comments: No edema.  2+ DP pulses in the left foot with normal sensation and movement of the toes  ?Skin: ?   General: Skin is warm and dry.  ?   Findings: No rash.  ?Neurological:  ?   Mental Status: She is alert and oriented to person, place, and time. Mental status is at baseline.  ?   Cranial Nerves: No cranial nerve deficit.  ?Psychiatric:     ?   Mood and Affect: Mood normal.     ?   Behavior: Behavior normal.  ? ? ?ED Results / Procedures / Treatments   ?Labs ?(all labs ordered are listed, but only abnormal results are displayed) ?Labs Reviewed  ?BASIC METABOLIC PANEL -  Abnormal; Notable for the following components:  ?    Result Value  ? Chloride 112 (*)   ? All other components within normal limits  ?PROTIME-INR - Abnormal; Notable for the following components:  ? Prothrombin Time 15.7 (*)   ? INR 1.3 (*)   ? All other components within normal limits  ?CBC WITH DIFFERENTIAL/PLATELET  ?TYPE AND SCREEN  ?ABO/RH  ? ? ?EKG ?EKG Interpretation ? ?Date/Time:  Thursday Oct 22 2021 22:50:20 EDT ?Ventricular Rate:  72 ?PR Interval:  184 ?QRS Duration: 82 ?QT Interval:  430 ?QTC Calculation: 470 ?R Axis:   64 ?Text Interpretation: Normal sinus rhythm Possible Left atrial enlargement No significant change since last tracing When compared with ECG of 16-Sep-2001 08:27, PREVIOUS ECG IS PRESENT Confirmed by Gwyneth Sprout ([id number]) on 10/22/2021 11:01:55 PM ? ?Radiology ?DG Chest Port 1 View ? ?Result Date: 10/22/2021 ?CLINICAL DATA:  Medical clearance EXAM: PORTABLE CHEST 1 VIEW COMPARISON:  07/11/2021 FINDINGS: The heart size and mediastinal contours are within normal limits. Both lungs are clear. The visualized skeletal structures are unremarkable. IMPRESSION: No active disease. Electronically Signed   By: Jasmine Pang M.D.   On: 10/22/2021 23:15  ? ?DG Hip Unilat With Pelvis 2-3 Views Left ? ?Result Date: 10/22/2021 ?CLINICAL DATA:  Possible deformity EXAM: DG HIP (WITH OR WITHOUT PELVIS) 2-3V LEFT COMPARISON:  None Available. FINDINGS: SI joints are non widened. Pubic symphysis and rami appear intact. Acute minimally displaced subcapital left femoral neck fracture. The femoral head projects in joint. IMPRESSION: Acute left femoral neck fracture. Electronically Signed   By: Jasmine Pang M.D.   On: 10/22/2021 23:16   ? ?Procedures ?Procedures  ? ? ?Medications Ordered in ED ?Medications  ?0.9 %  sodium chloride infusion (has no administration in time range)  ?morphine (PF) 4 MG/ML injection 4 mg (has no administration in time range)  ?ondansetron [facility]) injection 4 mg (4 mg Intravenous  Given 10/22/21 2258)  ? ? ?ED Course/ Medical Decision Making/ A&P ?  ?                        ?Medical Decision Making ?Amount and/or Complexity of Data Reviewed ?External Data Reviewed: notes. ?Labs: ordered. Decision-making details documented in ED Course. ?Radiology: ordered and independent interpretation performed. Decision-making details documented in ED Course. ?ECG/medicine tests: ordered and independent interpretation performed. Decision-making details documented in  ED Course. ? ?Risk ?Prescription drug management. ?Decision regarding hospitalization. ? ? ?Pt with multiple medical problems and comorbidities and presenting today with a complaint that caries a high risk for morbidity and mortality.  Presenting today with deformity and pain in her left hip as well as pain in the left knee.  Patient is neurovascularly intact at this time.  Patient does take Xarelto because she has a history of factor deficiency and has had PEs in the past.  Patient has no other areas of injury.  Concern for hip fracture at this time.  Medical screening labs done and imaging are pending.  Patient given pain control. ? ?11:44 PM ?I independently interpreted patient's labs and EKG.  EKG without acute findings, labs with a normal BMP, CBC, INR 1.3.  I have independently visualized and interpreted pt's images today.  Hip film shows a left femoral neck fracture.  Chest x-ray without acute findings.  Patient last took Xarelto on 10/21/2021 in the evening.  She has not taken it today.  Findings were discussed with the patient and her husband.  Patient will require admission, orthopedic consultation and admission to the hospitalist service.  She is still complaining of pain and feels that the fentanyl was not helpful.  She was changed over to morphine.  Spoke with Dr. Carola Frost.  NPO after midnight. ? ? ? ? ? ? ? ? ? ?Final Clinical Impression(s) / ED Diagnoses ?Final diagnoses:  ?Closed displaced fracture of left femoral neck (HCC)  ? ? ?Rx  / DC Orders ?ED Discharge Orders   ? ? None  ? ?  ? ? ?  ?Gwyneth Sprout, MD ?10/22/21 2338 ? ?  ?Gwyneth Sprout, MD ?10/22/21 2345 ? ?

## 2021-10-22 NOTE — Progress Notes (Addendum)
Consult request received for displaced left femoral neck fracture. ?Have discussed with the requesting MD patient's stability, pain control, and ongoing work up. She does not use any assistive devices. I have reviewed x-rays and developed a provisional plan for hip replacement, most likely a total hip replacement by one of my total joint colleagues. ? ?Full consultation to follow in the am. ?Appreciate Medicine Service. ? ?Myrene Galas, MD ?[facility] ?[phone] ? ?

## 2021-10-23 ENCOUNTER — Inpatient Hospital Stay (HOSPITAL_COMMUNITY): Payer: Medicare PPO | Admitting: Anesthesiology

## 2021-10-23 ENCOUNTER — Inpatient Hospital Stay (HOSPITAL_COMMUNITY): Payer: Medicare PPO

## 2021-10-23 ENCOUNTER — Encounter (HOSPITAL_COMMUNITY): Payer: Self-pay | Admitting: Internal Medicine

## 2021-10-23 ENCOUNTER — Other Ambulatory Visit: Payer: Self-pay

## 2021-10-23 ENCOUNTER — Encounter (HOSPITAL_COMMUNITY)
Admission: EM | Disposition: A | Discharge status: Home / Self Care | Payer: Self-pay | Source: Home / Self Care | Attending: Internal Medicine

## 2021-10-23 DIAGNOSIS — E785 Hyperlipidemia, unspecified: Secondary | ICD-10-CM | POA: Diagnosis present

## 2021-10-23 DIAGNOSIS — E669 Obesity, unspecified: Secondary | ICD-10-CM | POA: Diagnosis present

## 2021-10-23 DIAGNOSIS — Z86711 Personal history of pulmonary embolism: Secondary | ICD-10-CM | POA: Diagnosis not present

## 2021-10-23 DIAGNOSIS — L4050 Arthropathic psoriasis, unspecified: Secondary | ICD-10-CM | POA: Diagnosis present

## 2021-10-23 DIAGNOSIS — Z7901 Long term (current) use of anticoagulants: Secondary | ICD-10-CM | POA: Diagnosis not present

## 2021-10-23 DIAGNOSIS — D696 Thrombocytopenia, unspecified: Secondary | ICD-10-CM | POA: Diagnosis not present

## 2021-10-23 DIAGNOSIS — W010XXA Fall on same level from slipping, tripping and stumbling without subsequent striking against object, initial encounter: Secondary | ICD-10-CM | POA: Diagnosis present

## 2021-10-23 DIAGNOSIS — D6859 Other primary thrombophilia: Secondary | ICD-10-CM | POA: Diagnosis present

## 2021-10-23 DIAGNOSIS — Z6827 Body mass index (BMI) 27.0-27.9, adult: Secondary | ICD-10-CM | POA: Diagnosis not present

## 2021-10-23 DIAGNOSIS — Z79890 Hormone replacement therapy (postmenopausal): Secondary | ICD-10-CM | POA: Diagnosis not present

## 2021-10-23 DIAGNOSIS — S72012A Unspecified intracapsular fracture of left femur, initial encounter for closed fracture: Secondary | ICD-10-CM | POA: Diagnosis present

## 2021-10-23 DIAGNOSIS — S72002A Fracture of unspecified part of neck of left femur, initial encounter for closed fracture: Secondary | ICD-10-CM | POA: Diagnosis present

## 2021-10-23 DIAGNOSIS — E559 Vitamin D deficiency, unspecified: Secondary | ICD-10-CM

## 2021-10-23 DIAGNOSIS — I2510 Atherosclerotic heart disease of native coronary artery without angina pectoris: Secondary | ICD-10-CM | POA: Diagnosis present

## 2021-10-23 DIAGNOSIS — I10 Essential (primary) hypertension: Secondary | ICD-10-CM | POA: Diagnosis present

## 2021-10-23 DIAGNOSIS — E1165 Type 2 diabetes mellitus with hyperglycemia: Secondary | ICD-10-CM | POA: Diagnosis not present

## 2021-10-23 DIAGNOSIS — Z79899 Other long term (current) drug therapy: Secondary | ICD-10-CM | POA: Diagnosis not present

## 2021-10-23 DIAGNOSIS — S72009A Fracture of unspecified part of neck of unspecified femur, initial encounter for closed fracture: Secondary | ICD-10-CM | POA: Diagnosis present

## 2021-10-23 DIAGNOSIS — M25752 Osteophyte, left hip: Secondary | ICD-10-CM | POA: Diagnosis present

## 2021-10-23 DIAGNOSIS — D62 Acute posthemorrhagic anemia: Secondary | ICD-10-CM | POA: Diagnosis not present

## 2021-10-23 HISTORY — DX: Vitamin D deficiency, unspecified: E55.9

## 2021-10-23 HISTORY — PX: TOTAL HIP ARTHROPLASTY: SHX124

## 2021-10-23 HISTORY — DX: Obesity, unspecified: E66.9

## 2021-10-23 LAB — TYPE AND SCREEN
ABO/RH(D): B POS
Antibody Screen: NEGATIVE

## 2021-10-23 SURGERY — ARTHROPLASTY, HIP, TOTAL, ANTERIOR APPROACH
Anesthesia: General | Site: Hip | Laterality: Left

## 2021-10-23 MED ORDER — DIPHENHYDRAMINE HCL 12.5 MG/5ML PO ELIX: 12.5000 mg | ORAL_SOLUTION | ORAL | Status: DC | PRN

## 2021-10-23 MED ORDER — SODIUM CHLORIDE (PF) 0.9 % IJ SOLN
INTRAMUSCULAR | Status: AC
  Filled 2021-10-23: qty 30

## 2021-10-23 MED ORDER — SODIUM CHLORIDE 0.9 % IR SOLN
Status: DC | PRN
  Administered 2021-10-23 (×2): 1000 mL
  Administered 2021-10-23: 3000 mL

## 2021-10-23 MED ORDER — ROCURONIUM BROMIDE 10 MG/ML (PF) SYRINGE
PREFILLED_SYRINGE | INTRAVENOUS | Status: DC | PRN
  Administered 2021-10-23: 50 mg via INTRAVENOUS
  Administered 2021-10-23: 20 mg via INTRAVENOUS

## 2021-10-23 MED ORDER — PROPOFOL 10 MG/ML IV BOLUS
INTRAVENOUS | Status: AC
  Filled 2021-10-23: qty 20

## 2021-10-23 MED ORDER — ONDANSETRON HCL 4 MG/2ML IJ SOLN
INTRAMUSCULAR | Status: DC | PRN
  Administered 2021-10-23: 4 mg via INTRAVENOUS

## 2021-10-23 MED ORDER — METOCLOPRAMIDE HCL 5 MG PO TABS: 5.0000 mg | ORAL_TABLET | Freq: Three times a day (TID) | ORAL | Status: DC | PRN

## 2021-10-23 MED ORDER — WATER FOR IRRIGATION, STERILE IR SOLN
Status: DC | PRN
  Administered 2021-10-23: 2000 mL

## 2021-10-23 MED ORDER — LIDOCAINE HCL (CARDIAC) PF 100 MG/5ML IV SOSY
PREFILLED_SYRINGE | INTRAVENOUS | Status: DC | PRN
  Administered 2021-10-23 (×2): 50 mg via INTRAVENOUS

## 2021-10-23 MED ORDER — ROCURONIUM BROMIDE 10 MG/ML (PF) SYRINGE
PREFILLED_SYRINGE | INTRAVENOUS | Status: AC
  Filled 2021-10-23: qty 10

## 2021-10-23 MED ORDER — ACETAMINOPHEN 325 MG PO TABS: 325.0000 mg | ORAL_TABLET | Freq: Four times a day (QID) | ORAL | Status: DC | PRN

## 2021-10-23 MED ORDER — LEVOTHYROXINE SODIUM 88 MCG PO TABS
88.0000 ug | ORAL_TABLET | Freq: Every day | ORAL | Status: DC
  Administered 2021-10-23 – 2021-10-24 (×2): 88 ug via ORAL
  Filled 2021-10-23 (×3): qty 1

## 2021-10-23 MED ORDER — PROCHLORPERAZINE EDISYLATE 10 MG/2ML IJ SOLN
10.0000 mg | Freq: Once | INTRAMUSCULAR | Status: AC
  Administered 2021-10-23: 10 mg via INTRAVENOUS
  Filled 2021-10-23: qty 2

## 2021-10-23 MED ORDER — FENTANYL CITRATE (PF) 100 MCG/2ML IJ SOLN
INTRAMUSCULAR | Status: DC | PRN
  Administered 2021-10-23 (×3): 50 ug via INTRAVENOUS
  Administered 2021-10-23: 25 ug via INTRAVENOUS

## 2021-10-23 MED ORDER — MENTHOL 3 MG MT LOZG: 1.0000 | LOZENGE | OROMUCOSAL | Status: DC | PRN

## 2021-10-23 MED ORDER — LIDOCAINE HCL (PF) 2 % IJ SOLN
INTRAMUSCULAR | Status: AC
  Filled 2021-10-23: qty 5

## 2021-10-23 MED ORDER — KETOROLAC TROMETHAMINE 30 MG/ML IJ SOLN
INTRAMUSCULAR | Status: DC | PRN
  Administered 2021-10-23: 30 mg

## 2021-10-23 MED ORDER — ONDANSETRON HCL 4 MG/2ML IJ SOLN
INTRAMUSCULAR | Status: AC
  Filled 2021-10-23: qty 2

## 2021-10-23 MED ORDER — MORPHINE SULFATE (PF) 2 MG/ML IV SOLN
2.0000 mg | INTRAVENOUS | Status: DC | PRN
  Administered 2021-10-23: 2 mg via INTRAVENOUS
  Filled 2021-10-23 (×2): qty 1

## 2021-10-23 MED ORDER — DEXAMETHASONE SODIUM PHOSPHATE 10 MG/ML IJ SOLN
INTRAMUSCULAR | Status: DC | PRN
  Administered 2021-10-23: 10 mg via INTRAVENOUS

## 2021-10-23 MED ORDER — LACTATED RINGERS IV SOLN: INTRAVENOUS | Status: DC

## 2021-10-23 MED ORDER — METHOCARBAMOL 500 MG PO TABS
500.0000 mg | ORAL_TABLET | Freq: Four times a day (QID) | ORAL | Status: DC | PRN
  Administered 2021-10-23 – 2021-10-24 (×2): 500 mg via ORAL
  Filled 2021-10-23 (×2): qty 1

## 2021-10-23 MED ORDER — SODIUM CHLORIDE 0.9 % IV BOLUS
1000.0000 mL | Freq: Once | INTRAVENOUS | Status: AC
  Administered 2021-10-23: 1000 mL via INTRAVENOUS

## 2021-10-23 MED ORDER — SENNA 8.6 MG PO TABS
1.0000 | ORAL_TABLET | Freq: Two times a day (BID) | ORAL | Status: DC
  Administered 2021-10-23 – 2021-10-24 (×2): 8.6 mg via ORAL
  Filled 2021-10-23 (×2): qty 1

## 2021-10-23 MED ORDER — FENTANYL CITRATE (PF) 100 MCG/2ML IJ SOLN
INTRAMUSCULAR | Status: AC
  Administered 2021-10-23: 50 ug
  Filled 2021-10-23: qty 2

## 2021-10-23 MED ORDER — CEFAZOLIN SODIUM-DEXTROSE 2-4 GM/100ML-% IV SOLN
2.0000 g | Freq: Four times a day (QID) | INTRAVENOUS | Status: AC
  Administered 2021-10-23 – 2021-10-24 (×2): 2 g via INTRAVENOUS
  Filled 2021-10-23 (×2): qty 100

## 2021-10-23 MED ORDER — ADULT MULTIVITAMIN W/MINERALS CH
1.0000 | ORAL_TABLET | Freq: Every day | ORAL | Status: DC
  Administered 2021-10-24: 1 via ORAL
  Filled 2021-10-23: qty 1

## 2021-10-23 MED ORDER — CHLORHEXIDINE GLUCONATE 4 % EX LIQD: 60.0000 mL | Freq: Once | CUTANEOUS | Status: DC

## 2021-10-23 MED ORDER — PHENOL 1.4 % MT LIQD: 1.0000 | OROMUCOSAL | Status: DC | PRN

## 2021-10-23 MED ORDER — TRANEXAMIC ACID-NACL 1000-0.7 MG/100ML-% IV SOLN
1000.0000 mg | INTRAVENOUS | Status: AC
  Administered 2021-10-23: 1000 mg via INTRAVENOUS
  Filled 2021-10-23: qty 100

## 2021-10-23 MED ORDER — FENTANYL CITRATE (PF) 250 MCG/5ML IJ SOLN
INTRAMUSCULAR | Status: AC
  Filled 2021-10-23: qty 5

## 2021-10-23 MED ORDER — ONDANSETRON HCL 4 MG PO TABS: 4.0000 mg | ORAL_TABLET | Freq: Four times a day (QID) | ORAL | Status: DC | PRN

## 2021-10-23 MED ORDER — PROPOFOL 10 MG/ML IV BOLUS
INTRAVENOUS | Status: DC | PRN
Start: 2021-10-23 — End: 2021-10-23
  Administered 2021-10-23: 90 mg via INTRAVENOUS
  Administered 2021-10-23: 50 mg via INTRAVENOUS
  Administered 2021-10-23: 60 mg via INTRAVENOUS

## 2021-10-23 MED ORDER — SODIUM CHLORIDE (PF) 0.9 % IJ SOLN
INTRAMUSCULAR | Status: DC | PRN
  Administered 2021-10-23: 30 mL

## 2021-10-23 MED ORDER — ISOPROPYL ALCOHOL 70 % SOLN
Status: DC | PRN
Start: 2021-10-23 — End: 2021-10-23
  Administered 2021-10-23: 1 via TOPICAL

## 2021-10-23 MED ORDER — OXYCODONE HCL 5 MG PO TABS
5.0000 mg | ORAL_TABLET | ORAL | Status: DC | PRN
  Administered 2021-10-23: 5 mg via ORAL
  Filled 2021-10-23: qty 1

## 2021-10-23 MED ORDER — FENTANYL CITRATE PF 50 MCG/ML IJ SOSY: 50.0000 ug | PREFILLED_SYRINGE | Freq: Once | INTRAMUSCULAR | Status: DC

## 2021-10-23 MED ORDER — BUPIVACAINE-EPINEPHRINE 0.25% -1:200000 IJ SOLN
INTRAMUSCULAR | Status: DC | PRN
  Administered 2021-10-23: 30 mL

## 2021-10-23 MED ORDER — ROPIVACAINE HCL 5 MG/ML IJ SOLN
INTRAMUSCULAR | Status: DC | PRN
  Administered 2021-10-23: 20 mL via PERINEURAL

## 2021-10-23 MED ORDER — POVIDONE-IODINE 10 % EX SWAB
2.0000 "application " | Freq: Once | CUTANEOUS | Status: AC
  Administered 2021-10-23: 2 via TOPICAL

## 2021-10-23 MED ORDER — HYDROCODONE-ACETAMINOPHEN 5-325 MG PO TABS: 1.0000 | ORAL_TABLET | ORAL | Status: DC | PRN

## 2021-10-23 MED ORDER — ENSURE MAX PROTEIN PO LIQD
11.0000 [oz_av] | Freq: Every day | ORAL | Status: DC
  Administered 2021-10-24: 11 [oz_av] via ORAL
  Filled 2021-10-23: qty 330

## 2021-10-23 MED ORDER — CEFAZOLIN SODIUM-DEXTROSE 2-4 GM/100ML-% IV SOLN
2.0000 g | INTRAVENOUS | Status: AC
  Administered 2021-10-23: 2 g via INTRAVENOUS
  Filled 2021-10-23: qty 100

## 2021-10-23 MED ORDER — DOCUSATE SODIUM 100 MG PO CAPS
100.0000 mg | ORAL_CAPSULE | Freq: Two times a day (BID) | ORAL | Status: DC
  Administered 2021-10-24: 100 mg via ORAL
  Filled 2021-10-23 (×2): qty 1

## 2021-10-23 MED ORDER — POLYETHYLENE GLYCOL 3350 17 G PO PACK: 17.0000 g | PACK | Freq: Every day | ORAL | Status: DC | PRN

## 2021-10-23 MED ORDER — METOCLOPRAMIDE HCL 5 MG/ML IJ SOLN: 5.0000 mg | Freq: Three times a day (TID) | INTRAMUSCULAR | Status: DC | PRN

## 2021-10-23 MED ORDER — VITAMIN D 25 MCG (1000 UNIT) PO TABS
1000.0000 [IU] | ORAL_TABLET | Freq: Every day | ORAL | Status: DC
Start: 2021-10-23 — End: 2021-10-24
  Administered 2021-10-23 – 2021-10-24 (×2): 1000 [IU] via ORAL
  Filled 2021-10-23 (×2): qty 1

## 2021-10-23 MED ORDER — BUPIVACAINE-EPINEPHRINE (PF) 0.25% -1:200000 IJ SOLN
INTRAMUSCULAR | Status: AC
  Filled 2021-10-23: qty 30

## 2021-10-23 MED ORDER — DEXAMETHASONE SODIUM PHOSPHATE 10 MG/ML IJ SOLN
INTRAMUSCULAR | Status: AC
  Filled 2021-10-23: qty 1

## 2021-10-23 MED ORDER — ONDANSETRON HCL 4 MG/2ML IJ SOLN: 4.0000 mg | Freq: Four times a day (QID) | INTRAMUSCULAR | Status: DC | PRN

## 2021-10-23 MED ORDER — MORPHINE SULFATE (PF) 2 MG/ML IV SOLN: 0.5000 mg | INTRAVENOUS | Status: DC | PRN

## 2021-10-23 MED ORDER — ONDANSETRON HCL 4 MG/2ML IJ SOLN
4.0000 mg | Freq: Four times a day (QID) | INTRAMUSCULAR | Status: DC | PRN
  Administered 2021-10-23: 4 mg via INTRAVENOUS
  Filled 2021-10-23: qty 2

## 2021-10-23 MED ORDER — ACETAMINOPHEN 500 MG PO TABS
1000.0000 mg | ORAL_TABLET | Freq: Once | ORAL | Status: AC
  Administered 2021-10-23: 1000 mg via ORAL
  Filled 2021-10-23: qty 2

## 2021-10-23 MED ORDER — ACETAMINOPHEN 650 MG RE SUPP: 650.0000 mg | Freq: Four times a day (QID) | RECTAL | Status: DC | PRN

## 2021-10-23 MED ORDER — GABAPENTIN 300 MG PO CAPS
300.0000 mg | ORAL_CAPSULE | Freq: Two times a day (BID) | ORAL | Status: DC
  Administered 2021-10-23 – 2021-10-24 (×4): 300 mg via ORAL
  Filled 2021-10-23 (×4): qty 1

## 2021-10-23 MED ORDER — KETOROLAC TROMETHAMINE 30 MG/ML IJ SOLN
INTRAMUSCULAR | Status: AC
  Filled 2021-10-23: qty 1

## 2021-10-23 MED ORDER — ALUM & MAG HYDROXIDE-SIMETH 200-200-20 MG/5ML PO SUSP: 30.0000 mL | ORAL | Status: DC | PRN

## 2021-10-23 MED ORDER — DULOXETINE HCL 30 MG PO CPEP
30.0000 mg | ORAL_CAPSULE | Freq: Every day | ORAL | Status: DC
Start: 2021-10-23 — End: 2021-10-24
  Administered 2021-10-23: 30 mg via ORAL
  Filled 2021-10-23: qty 1

## 2021-10-23 MED ORDER — METHOCARBAMOL 1000 MG/10ML IJ SOLN
500.0000 mg | Freq: Four times a day (QID) | INTRAVENOUS | Status: DC | PRN
  Filled 2021-10-23: qty 5

## 2021-10-23 MED ORDER — SODIUM CHLORIDE 0.9 % IV SOLN: INTRAVENOUS | Status: DC

## 2021-10-23 MED ORDER — SUGAMMADEX SODIUM 200 MG/2ML IV SOLN
INTRAVENOUS | Status: DC | PRN
Start: 2021-10-23 — End: 2021-10-23
  Administered 2021-10-23: 200 mg via INTRAVENOUS

## 2021-10-23 MED ORDER — HYDROCODONE-ACETAMINOPHEN 7.5-325 MG PO TABS
1.0000 | ORAL_TABLET | ORAL | Status: DC | PRN
  Administered 2021-10-23 – 2021-10-24 (×4): 2 via ORAL
  Filled 2021-10-23 (×4): qty 2

## 2021-10-23 MED ORDER — RIVAROXABAN 10 MG PO TABS
10.0000 mg | ORAL_TABLET | Freq: Every day | ORAL | Status: DC
  Administered 2021-10-24: 10 mg via ORAL
  Filled 2021-10-23: qty 1

## 2021-10-23 MED ORDER — ACETAMINOPHEN 325 MG PO TABS
650.0000 mg | ORAL_TABLET | Freq: Four times a day (QID) | ORAL | Status: DC | PRN
  Administered 2021-10-23: 650 mg via ORAL
  Filled 2021-10-23: qty 2

## 2021-10-23 SURGICAL SUPPLY — 71 items
ACE SHELL 3H 52 E HIP (Shell) ×2 IMPLANT
BAG COUNTER SPONGE SURGICOUNT (BAG) ×1 IMPLANT
BAG DECANTER FOR FLEXI CONT (MISCELLANEOUS) IMPLANT
BAG ZIPLOCK 12X15 (MISCELLANEOUS) ×1 IMPLANT
CHLORAPREP W/TINT 26 (MISCELLANEOUS) ×2 IMPLANT
COVER PERINEAL POST (MISCELLANEOUS) ×2 IMPLANT
COVER SURGICAL LIGHT HANDLE (MISCELLANEOUS) ×2 IMPLANT
DERMABOND ADVANCED (GAUZE/BANDAGES/DRESSINGS) ×2
DERMABOND ADVANCED .7 DNX12 (GAUZE/BANDAGES/DRESSINGS) ×2 IMPLANT
DRAPE IMP U-DRAPE 54X76 (DRAPES) ×2 IMPLANT
DRAPE SHEET LG 3/4 BI-LAMINATE (DRAPES) ×6 IMPLANT
DRAPE STERI IOBAN 125X83 (DRAPES) ×2 IMPLANT
DRAPE U-SHAPE 47X51 STRL (DRAPES) ×4 IMPLANT
DRSG AQUACEL AG ADV 3.5X10 (GAUZE/BANDAGES/DRESSINGS) ×2 IMPLANT
DRSG TEGADERM 2-3/8X2-3/4 SM (GAUZE/BANDAGES/DRESSINGS) ×1 IMPLANT
DRSG TEGADERM 4X4.75 (GAUZE/BANDAGES/DRESSINGS) ×1 IMPLANT
ELECT REM PT RETURN 15FT ADLT (MISCELLANEOUS) ×2 IMPLANT
EVACUATOR DRAINAGE 10X20 100CC (DRAIN) IMPLANT
EVACUATOR SILICONE 100CC (DRAIN) ×2
GAUZE SPONGE 2X2 8PLY STRL LF (GAUZE/BANDAGES/DRESSINGS) IMPLANT
GAUZE SPONGE 4X4 12PLY STRL (GAUZE/BANDAGES/DRESSINGS) ×2 IMPLANT
GLOVE BIO SURGEON STRL SZ8.5 (GLOVE) ×4 IMPLANT
GLOVE BIOGEL M 7.0 STRL (GLOVE) ×2 IMPLANT
GLOVE BIOGEL PI IND STRL 7.5 (GLOVE) ×1 IMPLANT
GLOVE BIOGEL PI IND STRL 8 (GLOVE) ×1 IMPLANT
GLOVE BIOGEL PI IND STRL 8.5 (GLOVE) ×1 IMPLANT
GLOVE BIOGEL PI INDICATOR 7.5 (GLOVE) ×1
GLOVE BIOGEL PI INDICATOR 8 (GLOVE) ×1
GLOVE BIOGEL PI INDICATOR 8.5 (GLOVE) ×1
GLOVE SURG LX 7.5 STRW (GLOVE) ×2
GLOVE SURG LX STRL 7.5 STRW (GLOVE) ×2 IMPLANT
GOWN SPEC L3 XXLG W/TWL (GOWN DISPOSABLE) ×4 IMPLANT
HANDPIECE INTERPULSE COAX TIP (DISPOSABLE) ×2
HEAD FEM -6XOFST 36XMDLR (Orthopedic Implant) IMPLANT
HEAD MODULAR 36MM (Orthopedic Implant) ×1 IMPLANT
HOLDER FOLEY CATH W/STRAP (MISCELLANEOUS) ×2 IMPLANT
HOOD PEEL AWAY FLYTE STAYCOOL (MISCELLANEOUS) ×6 IMPLANT
KIT TURNOVER KIT A (KITS) IMPLANT
LINER ACE G7 HIGH 36 SZ E (Liner) ×1 IMPLANT
MANIFOLD NEPTUNE II (INSTRUMENTS) ×2 IMPLANT
MARKER SKIN DUAL TIP RULER LAB (MISCELLANEOUS) ×2 IMPLANT
NDL SAFETY ECLIPSE 18X1.5 (NEEDLE) ×1 IMPLANT
NDL SPNL 18GX3.5 QUINCKE PK (NEEDLE) ×1 IMPLANT
NEEDLE HYPO 18GX1.5 SHARP (NEEDLE) ×2
NEEDLE SPNL 18GX3.5 QUINCKE PK (NEEDLE) ×2 IMPLANT
PACK ANTERIOR HIP CUSTOM (KITS) ×2 IMPLANT
PENCIL SMOKE EVACUATOR (MISCELLANEOUS) IMPLANT
SAW OSC TIP CART 19.5X105X1.3 (SAW) ×2 IMPLANT
SEALER BIPOLAR AQUA 6.0 (INSTRUMENTS) ×2 IMPLANT
SET HNDPC FAN SPRY TIP SCT (DISPOSABLE) ×1 IMPLANT
SHELL ACETAB 3H 52 E HIP (Shell) IMPLANT
SLEEVE SCD COMPRESS KNEE MED (STOCKING) ×1 IMPLANT
SOLUTION PRONTOSAN WOUND 350ML (IRRIGATION / IRRIGATOR) ×2 IMPLANT
SPIKE FLUID TRANSFER (MISCELLANEOUS) ×1 IMPLANT
SPONGE GAUZE 2X2 STER 10/PKG (GAUZE/BANDAGES/DRESSINGS) ×1
STAPLER INSORB 30 2030 C-SECTI (MISCELLANEOUS) IMPLANT
STAPLER VISISTAT 35W (STAPLE) ×1 IMPLANT
STEM FEM CMTLS 13 146 (Stem) ×1 IMPLANT
SUT ETHILON 3 0 PS 1 (SUTURE) ×1 IMPLANT
SUT MNCRL AB 3-0 PS2 18 (SUTURE) ×2 IMPLANT
SUT MON AB 2-0 CT1 36 (SUTURE) ×2 IMPLANT
SUT STRATAFIX PDO 1 14 VIOLET (SUTURE) ×1
SUT STRATFX PDO 1 14 VIOLET (SUTURE) ×1
SUT VIC AB 2-0 CT1 27 (SUTURE)
SUT VIC AB 2-0 CT1 TAPERPNT 27 (SUTURE) IMPLANT
SUTURE STRATFX PDO 1 14 VIOLET (SUTURE) ×1 IMPLANT
SYR 3ML LL SCALE MARK (SYRINGE) ×2 IMPLANT
TRAY FOLEY MTR SLVR 14FR STAT (SET/KITS/TRAYS/PACK) ×1 IMPLANT
TRAY FOLEY MTR SLVR 16FR STAT (SET/KITS/TRAYS/PACK) IMPLANT
TUBE SUCTION HIGH CAP CLEAR NV (SUCTIONS) ×2 IMPLANT
WATER STERILE IRR 1000ML POUR (IV SOLUTION) ×2 IMPLANT

## 2021-10-23 NOTE — Op Note (Signed)
OPERATIVE REPORT ? ?SURGEON: Samson Frederic, MD  ? ?ASSISTANT: Clint Bolder, PA-C ? ?PREOPERATIVE DIAGNOSIS: Displaced Left femoral neck fracture.  ? ?POSTOPERATIVE DIAGNOSIS: Displaced Left femoral neck fracture.  ? ?PROCEDURE: Left total hip arthroplasty, anterior approach.  ? ?IMPLANTS: Biomet Taperloc Microplasty stem, size 13x164mm, high offset. ?Biomet G7 OsseoTi Cup, size 52 mm. ?Biomet Vivacit-E liner, size 36 mm, E, neutral. ?Biomet metal head ball, size 36 - 6 mm. ? ?ANESTHESIA:  General ? ?ANTIBIOTICS: 2g ancef. ? ?ESTIMATED BLOOD LOSS:-250 mL   ? ?DRAINS: 10 mm flat JP drain in the subcutaneous tissue. ? ?COMPLICATIONS: None ?  ?CONDITION: PACU - hemodynamically stable.  ? ?BRIEF CLINICAL NOTE: Ann Lester is a 75 y.o. female with a displaced Left femoral neck fracture. The patient was admitted to the hospitalist service and underwent perioperative risk stratification and medical optimization. The risks, benefits, and alternatives to total hip arthroplasty were explained, and the patient elected to proceed. ? ?PROCEDURE IN DETAIL: The patient was taken to the operating room and general anesthesia was induced on the hospital bed.  The patient was then positioned on the Hana table.  All bony prominences were well padded.  The hip was prepped and draped in the normal sterile surgical fashion.  A time-out was called verifying side and site of surgery. Antibiotics were given within 60 minutes of beginning the procedure. ?  ?Bikini incision was made, and the direct anterior approach to the hip was performed through the Hueter interval.  Lateral femoral circumflex vessels were treated with the Auqumantys. The anterior capsule was exposed and an inverted T capsulotomy was made.  Fracture hematoma was encountered and evacuated. The patient was found to have a comminuted Left subcapital femoral neck fracture.  I freshened the femoral neck cut with a saw.  I removed the femoral neck fragment.  A corkscrew was  placed into the head and the head was removed.  This was passed to the back table and was measured. The pubofemoral ligament was released subperiosteally to the lesser trochanter. ? ?Acetabular exposure was achieved, and the pulvinar and labrum were excised. Sequential reaming of the acetabulum was then performed up to a size 51 mm reamer under direct visulization. A 52 mm cup was then opened and impacted into place at approximately 40 degrees of abduction and 20 degrees of anteversion. The final polyethylene liner was impacted into place and acetabular osteophytes were removed.  ?  ?I then gained femoral exposure taking care to protect the abductors and greater trochanter.  This was performed using standard external rotation, extension, and adduction.  A cookie cutter was used to enter the femoral canal, and then the femoral canal finder was placed.  Sequential broaching was performed up to a size 13.  Calcar planer was used on the femoral neck remnant.  I placed a high offset neck and a trial head ball.  The hip was reduced.  Leg lengths and offset were checked fluoroscopically.  The hip was dislocated and trial components were removed.  The final implants were placed, and the hip was reduced.  Fluoroscopy was used to confirm component position and leg lengths.  At 90 degrees of external rotation and full extension, the hip was stable to an anterior directed force. ?  ?The wound was copiously irrigated with Irrisept solution and normal saline using pule lavage.  Marcaine solution was injected into the periarticular soft tissue.  Through a separate stab incision over the lateral thigh, a 10 mm flat JP drain was  placed in the subcutaneous tissue and sewn in with 3-0 nylon. The wound was closed in layers using #1 Stratafix for the fascia, 2-0 Vicryl for the deep fatty tissue, and staples + Dermabond for the skin.  Once the glue was fully dried, an Aquacell Ag dressing was applied.  The patient was transported to the  recovery room in stable condition.  Sponge, needle, and instrument counts were correct at the end of the case x2.  The patient tolerated the procedure well and there were no known complications. ? ?Please note that a surgical assistant was a medical necessity for this procedure to perform it in a safe and expeditious manner. Assistant was necessary to provide appropriate retraction of vital neurovascular structures, to prevent femoral fracture, and to allow for anatomic placement of the prosthesis. ?

## 2021-10-23 NOTE — Anesthesia Preprocedure Evaluation (Addendum)
Anesthesia Evaluation  ?Patient identified by MRN, date of birth, ID band ?Patient awake ? ? ? ?Reviewed: ?Allergy & Precautions, NPO status , Patient's Chart, lab work & pertinent test results, reviewed documented beta blocker date and time  ? ?Airway ?Mallampati: II ? ?TM Distance: >3 FB ? ? ? ? Dental ? ?(+) Teeth Intact, Dental Advisory Given ?  ?Pulmonary ?neg pulmonary ROS,  ?  ?breath sounds clear to auscultation ? ? ? ? ? ? Cardiovascular ?hypertension, Pt. on home beta blockers ?+ CAD  ? ?Rhythm:Regular Rate:Normal ? ? ?  ?Neuro/Psych ?negative neurological ROS ? negative psych ROS  ? GI/Hepatic ?negative GI ROS, Neg liver ROS,   ?Endo/Other  ?negative endocrine ROS ? Renal/GU ?negative Renal ROS  ? ?  ?Musculoskeletal ? ?(+) Arthritis ,  ? Abdominal ?Normal abdominal exam  (+)   ?Peds ? Hematology ?negative hematology ROS ?(+)   ?Anesthesia Other Findings ? ? Reproductive/Obstetrics ? ?  ? ? ? ? ? ? ? ? ? ? ? ? ? ?  ?  ? ? ? ? ? ? ? ?Anesthesia Physical ?Anesthesia Plan ? ?ASA: 2 ? ?Anesthesia Plan: General  ? ?Post-op Pain Management:   ? ?Induction: Intravenous ? ?PONV Risk Score and Plan: 4 or greater and Ondansetron, Dexamethasone and Treatment may vary due to age or medical condition ? ?Airway Management Planned: Oral ETT ? ?Additional Equipment: None ? ?Intra-op Plan:  ? ?Post-operative Plan: Extubation in OR ? ?Informed Consent: I have reviewed the patients History and Physical, chart, labs and discussed the procedure including the risks, benefits and alternatives for the proposed anesthesia with the patient or authorized representative who has indicated his/her understanding and acceptance.  ? ? ? ?Dental advisory given ? ?Plan Discussed with: CRNA ? ?Anesthesia Plan Comments:   ? ? ? ? ? ?Anesthesia Quick Evaluation ? ?

## 2021-10-23 NOTE — Assessment & Plan Note (Signed)
Admit to MedSurg bed.  Keep NPO.  Orthopedics is in consulted. ?

## 2021-10-23 NOTE — Progress Notes (Signed)
Initial Nutrition Assessment ? ?DOCUMENTATION CODES:  ? ?Not applicable ? ?INTERVENTION:  ? ?Ensure Max protein supplement BID, each supplement provides 150kcal and 30g of protein.  ? ?MVI po daily  ? ?Recommend liberal diet  ? ?NUTRITION DIAGNOSIS:  ? ?Increased nutrient needs related to hip fracture as evidenced by estimated needs. ? ?GOAL:  ? ?Patient will meet greater than or equal to 90% of their needs ? ?MONITOR:  ? ?PO intake, Supplement acceptance, Labs, Weight trends, Skin, I & O's ? ?REASON FOR ASSESSMENT:  ? ?Consult ?Hip fracture protocol ? ?ASSESSMENT:  ? ?76 y/o female with h/o PE. psoriatic arthritis, vitamin D deficiency, HLD, HTN and CAD who is admitted with hip fracture after fall. ? ?RD working remotely. ? ?Spoke with pt via phone. Pt reports good appetite and oral intake at baseline; pt follows a low carbohydrate diet at home and drinks chocolate Ensure Max and Atkins shakes daily. Pt NPO today for surgery. RD discussed with pt the importance of adequate nutrition needed to preserve lean muscle and support post op healing. Recommended continue protein shakes, daily MVI and vitamin D supplementation at home. RD will add supplements and MVI to help pt meet her estimated needs in hospital. There is no documented weight history in chart to determine if any significant recent weight changes. Pt reports her weight is stable. RD will follow up to obtain NFPE at follow-up.  ? ?Medications reviewed and include: vitamin D3, synthroid, NaCl @125ml /hr, cefazolin ? ?Labs reviewed: K 3.8 wnl ? ?NUTRITION - FOCUSED PHYSICAL EXAM: ?Unable to perform at this time  ? ?Diet Order:   ?Diet Order   ? ?       ?  Diet NPO time specified Except for: Sips with Meds  Diet effective now       ?  ? ?  ?  ? ?  ? ?EDUCATION NEEDS:  ? ?Education needs have been addressed ? ?Skin:  Skin Assessment: Reviewed RN Assessment ? ?Last BM:  pta ? ?Height:  ? ?Ht Readings from Last 1 Encounters:  ?10/22/21 5\' 5"  (1.651 m)  ? ? ?Weight:   ? ?Wt Readings from Last 1 Encounters:  ?10/22/21 75.8 kg  ? ? ?Ideal Body Weight:  56.8 kg ? ?BMI:  Body mass index is 27.79 kg/m?. ? ?Estimated Nutritional Needs:  ? ?Kcal:  1700-1900kcal/day ? ?Protein:  85-95g/day ? ?Fluid:  1.4-1.6L/day ? ?Koleen Distance MS, RD, LDN ?Please refer to [facility] for RD and/or RD on-call/weekend/after hours pager ? ?

## 2021-10-23 NOTE — Anesthesia Postprocedure Evaluation (Signed)
Anesthesia Post Note ? ?Patient: Ann Lester ? ?Procedure(s) Performed: TOTAL HIP ARTHROPLASTY ANTERIOR APPROACH (Left: Hip) ? ?  ? ?Patient location during evaluation: PACU ?Anesthesia Type: General ?Level of consciousness: awake and alert ?Pain management: pain level controlled ?Vital Signs Assessment: post-procedure vital signs reviewed and stable ?Respiratory status: spontaneous breathing, nonlabored ventilation, respiratory function stable and patient connected to nasal cannula oxygen ?Cardiovascular status: blood pressure returned to baseline and stable ?Postop Assessment: no apparent nausea or vomiting ?Anesthetic complications: no ? ? ?No notable events documented. ? ?Last Vitals:  ?Vitals:  ? 10/23/21 1815 10/23/21 1843  ?BP: 137/90 140/66  ?Pulse:  88  ?Resp: 14 15  ?Temp:  36.7 ?C  ?SpO2:  (!) 88%  ?  ?Last Pain:  ?Vitals:  ? 10/23/21 1843  ?TempSrc: Oral  ?PainSc:   ? ? ?  ?  ?  ?  ?  ?  ? ?Shelton Silvas ? ? ? ? ?

## 2021-10-23 NOTE — Consult Note (Signed)
? ? ?ORTHOPAEDIC CONSULTATION ? ?REQUESTING PHYSICIAN: Dorcas Carrow, MD ? ?PCP:  Abner Greenspan, MD ? ?Chief Complaint: left hip injury ? ?HPI: ?Ann Lester is a 76 y.o. female with a history of psoriatic arthritis who sustained a ground-level fall at home, injuring her left hip.  She had left hip pain and inability to weight-bear.  She was brought to the emergency department, where x-rays revealed a displaced left femoral neck fracture.  Orthopedic consultation was placed for management of her left hip fracture.  She takes Xarelto for hypercoagulable state, history of PE; last dose was 5/10.  She normally ambulates without any assist device.  She denies other injuries. ? ?Past Medical History:  ?Diagnosis Date  ? Coronary artery disease   ? Hyperlipidemia   ? Hypertension   ? Psoriatic arthritis (HCC)   ? ?Past Surgical History:  ?Procedure Laterality Date  ? ORIF PATELLA FRACTURE    ? SHOULDER ARTHROSCOPY WITH ROTATOR CUFF REPAIR Right 02/02/2021  ? ?Social History  ? ?Socioeconomic History  ? Marital status: Married  ?  Spouse name: Not on file  ? Number of children: Not on file  ? Years of education: Not on file  ? Highest education level: Not on file  ?Occupational History  ? Not on file  ?Tobacco Use  ? Smoking status: Never  ? Smokeless tobacco: Never  ?Substance and Sexual Activity  ? Alcohol use: Yes  ?  Alcohol/week: 14.0 standard drinks  ?  Types: 14 Standard drinks or equivalent per week  ? Drug use: Never  ? Sexual activity: Not on file  ?Other Topics Concern  ? Not on file  ?Social History Narrative  ? Not on file  ? ?Social Determinants of Health  ? ?Financial Resource Strain: Not on file  ?Food Insecurity: Not on file  ?Transportation Needs: Not on file  ?Physical Activity: Not on file  ?Stress: Not on file  ?Social Connections: Not on file  ? ?History reviewed. No pertinent family history. ?No Known Allergies ?Prior to Admission medications   ?Medication Sig Start Date End Date Taking?  Authorizing Provider  ?acetaminophen (TYLENOL) 500 MG tablet Take 1,000 mg by mouth at bedtime.   Yes [provider]  ?bismuth subsalicylate (PEPTO BISMOL) 262 MG/15ML suspension Take 30 mLs by mouth every 6 (six) hours as needed for indigestion or diarrhea or loose stools.   Yes [provider]  ?Cholecalciferol (VITAMIN D3 PO) Take 1 tablet by mouth daily. Unsure of dose   Yes [provider]  ?Coenzyme Q10 (CO Q-10 PO) Take 1 capsule by mouth daily. Unsure of dose   Yes [provider]  ?COSENTYX 150 MG/ML SOSY Inject 150 mg into the skin every 30 (thirty) days. 10/21/21  Yes [provider]  ?Cyanocobalamin (VITAMIN B-12) 1000 MCG SUBL Place 1,000 mcg under the tongue daily.   Yes [provider]  ?DULoxetine (CYMBALTA) 30 MG capsule Take 30 mg by mouth at bedtime. 09/24/21  Yes [provider]  ?fluticasone (FLONASE) 50 MCG/ACT nasal spray Place 1 spray into both nostrils daily as needed for allergies or rhinitis.   Yes [provider]  ?folic acid (FOLVITE) 1 MG tablet Take 1 mg by mouth daily. 09/24/21  Yes [provider]  ?gabapentin (NEURONTIN) 300 MG capsule Take 300 mg by mouth 2 (two) times daily. 07/20/21  Yes [provider]  ?leflunomide (ARAVA) 20 MG tablet Take 20 mg by mouth daily. 09/16/21  Yes [provider]  ?levothyroxine (  SYNTHROID) 88 MCG tablet Take 88 mcg by mouth daily. 10/08/21  Yes [provider]  ?loratadine (CLARITIN) 10 MG tablet Take 10 mg by mouth daily. 06/16/21  Yes [provider]  ?metoprolol tartrate (LOPRESSOR) 25 MG tablet Take 25 mg by mouth daily. 10/05/21  Yes [provider]  ?verapamil (CALAN-SR) 240 MG CR tablet Take 240 mg by mouth daily. 10/12/21  Yes [provider]  ?XARELTO 20 MG TABS tablet Take 20 mg by mouth every evening. 08/20/21  Yes [provider]  ? ?DG Chest Port 1 View ? ?Result Date: 10/22/2021 ?CLINICAL DATA:  Medical  clearance EXAM: PORTABLE CHEST 1 VIEW COMPARISON:  07/11/2021 FINDINGS: The heart size and mediastinal contours are within normal limits. Both lungs are clear. The visualized skeletal structures are unremarkable. IMPRESSION: No active disease. Electronically Signed   By: Jasmine Pang M.D.   On: 10/22/2021 23:15  ? ?DG Knee Left Port ? ?Result Date: 10/23/2021 ?CLINICAL DATA:  Left femoral neck fracture EXAM: PORTABLE LEFT KNEE - 1-2 VIEW COMPARISON:  Hip radiographs of 1 day prior. Knee radiographs 01/02/2016 from [facility] FINDINGS: No acute fracture or dislocation. Mild medial and moderate patellofemoral compartment joint space narrowing. Small suprapatellar joint effusion. Enthesophytes at the quadriceps insertion and patellar tendon origin. IMPRESSION: Small suprapatellar joint effusion, similar to 2017. Mild osteoarthritis. Electronically Signed   By: Jeronimo Greaves M.D.   On: 10/23/2021 08:10  ? ?DG Hip Unilat With Pelvis 2-3 Views Left ? ?Result Date: 10/22/2021 ?CLINICAL DATA:  Possible deformity EXAM: DG HIP (WITH OR WITHOUT PELVIS) 2-3V LEFT COMPARISON:  None Available. FINDINGS: SI joints are non widened. Pubic symphysis and rami appear intact. Acute minimally displaced subcapital left femoral neck fracture. The femoral head projects in joint. IMPRESSION: Acute left femoral neck fracture. Electronically Signed   By: Jasmine Pang M.D.   On: 10/22/2021 23:16   ? ?Positive ROS: All other systems have been reviewed and were otherwise negative with the exception of those mentioned in the HPI and as above. ? ?Physical Exam: ?General: Alert, no acute distress ?Cardiovascular: No pedal edema ?Respiratory: No cyanosis, no use of accessory musculature ?GI: No organomegaly, abdomen is soft and non-tender ?Skin: No lesions in the area of chief complaint ?Neurologic: Sensation intact distally ?Psychiatric: Patient is competent for consent with normal mood and affect ?Lymphatic: No axillary or cervical  lymphadenopathy ? ?MUSCULOSKELETAL: Examination of the left hip reveals no skin wounds or lesions.  She is shortened and externally rotated.  She has pain with logrolling of the hip.  She is neurovascularly intact distally. ? ?Assessment: ?Displaced left femoral neck fracture. ?Psoriatic arthritis. ?Hypercoagulable state, history of pulmonary embolism on Xarelto. ? ?Plan: ?I discussed the findings with the patient.  We discussed her x-rays.  I recommend left total hip arthroplasty for pain control and to allow immediate mobilization out of bed.  We discussed the risk, benefits, and alternatives.  Please see statement of risk.  She is at an increased risk for periprosthetic joint infection due to psoriatic arthritis and anticoagulant usage.  All questions were solicited and answered. ? ?The risks, benefits, and alternatives were discussed with the patient. There are risks associated with the surgery including, but not limited to, problems with anesthesia (death), infection, instability (giving out of the joint), dislocation, differences in leg length/angulation/rotation, fracture of bones, loosening or failure of implants, hematoma (blood accumulation) which may require surgical drainage, blood clots, pulmonary embolism, nerve injury (foot drop and lateral thigh numbness), and  blood vessel injury. The patient understands these risks and elects to proceed. ? ? ?Jonette Pesa, MD ?[phone]  ? ? ?10/23/2021 ?2:46 PM ? ?

## 2021-10-23 NOTE — TOC CAGE-AID Note (Signed)
Transition of Care (TOC) - CAGE-AID Screening ? ? ?Patient Details  ?Name: Ann Lester ?MRN: [id number] ?Date of Birth: 02-Mar-1946 ? ?Transition of Care (TOC) CM/SW Contact:    ?Clifford Benninger C Tarpley-Carter, LCSWA ?Phone Number: ?10/23/2021, 10:03 AM ? ? ?Clinical Narrative: ?Pt participated in [place].  Pt stated she does not use substance or ETOH.  Pt was not offered resources, due to no usage of substance or ETOH.    ? ?Passenger transport manager, MSW, LCSW-A ?Pronouns:  She/Her/Hers ?[facility] ?Clinical Social Worker ?Direct Number:  [phone] ?[email or web address] ? ?CAGE-AID Screening: ?  ? ?Have You Ever Felt You Ought to Cut Down on Your Drinking or Drug Use?: No ?Have People Annoyed You By Critizing Your Drinking Or Drug Use?: No ?Have You Felt Bad Or Guilty About Your Drinking Or Drug Use?: No ?Have You Ever Had a Drink or Used Drugs First Thing In The Morning to Steady Your Nerves or to Get Rid of a Hangover?: No ?CAGE-AID Score: 0 ? ?Substance Abuse Education Offered: No ? ?  ? ? ? ? ? ? ?

## 2021-10-23 NOTE — Anesthesia Pain Management Evaluation Note (Signed)
?  Anesthesia Pain Consult Note ? ?Patient: Ann Lester, 76 y.o., female ? ?Consult Requested by: Dorcas Carrow, MD ? ?Reason for Consult: Left hip fracture ? ?Level of Consciousness: alert ? ?Pain: moderate  ? ?Last Vitals:  ?Vitals:  ? 10/23/21 0915 10/23/21 0940  ?BP: 107/74 (!) 114/56  ?Pulse: 77 73  ?Resp: 14 12  ?Temp: 36.6 ?C 36.8 ?C  ?SpO2: 97% 96%  ? ? ?Plan: Peripheral nerve block for pain control ? ?Risks of wet tap, epidural hematoma and spinal cord injury explained to:  ? ?Consent:Risks of procedure as well as the alternatives and risks of each were explained to the (patient/caregiver).  Consent for procedure obtained. ? ?Advance Directive:Patient did not wish to name a surrogate decision maker or provide an advance care plan.  ? ?No Known Allergies ? ?Physical exam: ?PULM normal  ?CARDIO Heart sounds are normal.  Regular rate and rhythm without murmur, gallop or rub.  ?OTHER   ? ?I have reviewed the patient's medications listed below. ?? cholecalciferol  1,000 Units Oral Daily  ?? DULoxetine  30 mg Oral QHS  ?? fentaNYL (SUBLIMAZE) injection  50 mcg Intravenous Once  ?? gabapentin  300 mg Oral BID  ?? levothyroxine  88 mcg Oral Q0600  ? ?? sodium chloride 125 mL/hr at 10/23/21 0024  ? ?acetaminophen **OR** acetaminophen, morphine injection, ondansetron **OR** ondansetron (ZOFRAN) IV, oxyCODONE ? ?Past Medical History:  ?Diagnosis Date  ?? Coronary artery disease   ?? Hyperlipidemia   ?? Hypertension   ?? Psoriatic arthritis (HCC)   ? ?Past Surgical History:  ?Procedure Laterality Date  ?? ORIF PATELLA FRACTURE    ? ? reports that she has never smoked. She has never used smokeless tobacco. She reports current alcohol use of about 14.0 standard drinks per week. She reports that she does not use drugs.  ? ? ?Collene Schlichter ?10/23/2021 ? ? ? ? ?

## 2021-10-23 NOTE — Assessment & Plan Note (Signed)
Stable.  Last dose of Xarelto was on 10/22/2018 3 in the evening.  Continue to hold Xarelto for now. ?

## 2021-10-23 NOTE — Assessment & Plan Note (Signed)
Stable

## 2021-10-23 NOTE — Progress Notes (Signed)
Asked to take over care by Dr. Carola Frost. Patient requires L THA. Due to lack of [facility] OR availability, surgery will be today at [facility] at 2:30. [facility] will confirm postop location with [facility]. Continue NPO, hold chemical DVT ppx. Last xarelto dose was 5/10. Preop orders placed. ?

## 2021-10-23 NOTE — Discharge Instructions (Signed)
? ?Dr. Veronique Warga ?Joint Replacement Specialist ?[facility] ?[address] ?[place], [place] [address] ?[phone] ? ? ?TOTAL HIP REPLACEMENT POSTOPERATIVE DIRECTIONS ? ? ? ?Hip Rehabilitation, Guidelines Following Surgery  ? ?WEIGHT BEARING ?Weight bearing as tolerated with assist device (walker, cane, etc) as directed, use it as long as suggested by your surgeon or therapist, typically at least 4-6 weeks. ? ?The results of a hip operation are greatly improved after range of motion and muscle strengthening exercises. Follow all safety measures which are given to protect your hip. If any of these exercises cause increased pain or swelling in your joint, decrease the amount until you are comfortable again. Then slowly increase the exercises. Call your caregiver if you have problems or questions.  ? ?HOME CARE INSTRUCTIONS  ?Most of the following instructions are designed to prevent the dislocation of your new hip.  ?Remove items at home which could result in a fall. This includes throw rugs or furniture in walking pathways.  ?Continue medications as instructed at time of discharge. ?You may have some home medications which will be placed on hold until you complete the course of blood thinner medication. ?You may start showering once you are discharged home. Do not remove your dressing. ?Do not put on socks or shoes without following the instructions of your caregivers.   ?Sit on chairs with arms. Use the chair arms to help push yourself up when arising.  ?Arrange for the use of a toilet seat elevator so you are not sitting low.  ?Walk with walker as instructed.  ?You may resume a sexual relationship in one month or when given the OK by your caregiver.  ?Use walker as long as suggested by your caregivers.  ?You may put full weight on your legs and walk as much as is comfortable. ?Avoid periods of inactivity such as sitting longer than an hour when not asleep. This helps prevent blood  clots.  ?You may return to work once you are cleared by your surgeon.  ?Do not drive a car for 6 weeks or until released by your surgeon.  ?Do not drive while taking narcotics.  ?Wear elastic stockings for two weeks following surgery during the day but you may remove then at night.  ?Make sure you keep all of your appointments after your operation with all of your doctors and caregivers. You should call the office at the above phone number and make an appointment for approximately two weeks after the date of your surgery. ?Please pick up a stool softener and laxative for home use as long as you are requiring pain medications. ?ICE to the affected hip every three hours for 30 minutes at a time and then as needed for pain and swelling. Continue to use ice on the hip for pain and swelling from surgery. You may notice swelling that will progress down to the foot and ankle.  This is normal after surgery.  Elevate the leg when you are not up walking on it.   ?It is important for you to complete the blood thinner medication as prescribed by your doctor. ?Continue to use the breathing machine which will help keep your temperature down.  It is common for your temperature to cycle up and down following surgery, especially at night when you are not up moving around and exerting yourself.  The breathing machine keeps your lungs expanded and your temperature down. ? ?RANGE OF MOTION AND STRENGTHENING EXERCISES  ?These exercises are designed to help you   keep full movement of your hip joint. Follow your caregiver's or physical therapist's instructions. Perform all exercises about fifteen times, three times per day or as directed. Exercise both hips, even if you have had only one joint replacement. These exercises can be done on a training (exercise) mat, on the floor, on a table or on a bed. Use whatever works the best and is most comfortable for you. Use music or television while you are exercising so that the exercises are a  pleasant break in your day. This will make your life better with the exercises acting as a break in routine you can look forward to.  ?Lying on your back, slowly slide your foot toward your buttocks, raising your knee up off the floor. Then slowly slide your foot back down until your leg is straight again.  ?Lying on your back spread your legs as far apart as you can without causing discomfort.  ?Lying on your side, raise your upper leg and foot straight up from the floor as far as is comfortable. Slowly lower the leg and repeat.  ?Lying on your back, tighten up the muscle in the front of your thigh (quadriceps muscles). You can do this by keeping your leg straight and trying to raise your heel off the floor. This helps strengthen the largest muscle supporting your knee.  ?Lying on your back, tighten up the muscles of your buttocks both with the legs straight and with the knee bent at a comfortable angle while keeping your heel on the floor.  ? ?SKILLED REHAB INSTRUCTIONS: ?If the patient is transferred to a skilled rehab facility following release from the hospital, a list of the current medications will be sent to the facility for the patient to continue.  When discharged from the skilled rehab facility, please have the facility set up the patient's Home Health Physical Therapy prior to being released. Also, the skilled facility will be responsible for providing the patient with their medications at time of release from the facility to include their pain medication and their blood thinner medication. If the patient is still at the rehab facility at time of the two week follow up appointment, the skilled rehab facility will also need to assist the patient in arranging follow up appointment in our office and any transportation needs. ? ?POST-OPERATIVE OPIOID TAPER INSTRUCTIONS: ?It is important to wean off of your opioid medication as soon as possible. If you do not need pain medication after your surgery it is ok  to stop day one. ?Opioids include: ?Codeine, Hydrocodone(Norco, Vicodin), Oxycodone(Percocet, oxycontin) and hydromorphone amongst others.  ?Long term and even short term use of opiods can cause: ?Increased pain response ?Dependence ?Constipation ?Depression ?Respiratory depression ?And more.  ?Withdrawal symptoms can include ?Flu like symptoms ?Nausea, vomiting ?And more ?Techniques to manage these symptoms ?Hydrate well ?Eat regular healthy meals ?Stay active ?Use relaxation techniques(deep breathing, meditating, yoga) ?Do Not substitute Alcohol to help with tapering ?If you have been on opioids for less than two weeks and do not have pain than it is ok to stop all together.  ?Plan to wean off of opioids ?This plan should start within one week post op of your joint replacement. ?Maintain the same interval or time between taking each dose and first decrease the dose.  ?Cut the total daily intake of opioids by one tablet each day ?Next start to increase the time between doses. ?The last dose that should be eliminated is the evening dose.  ? ? ?MAKE   SURE YOU:  ?Understand these instructions.  ?Will watch your condition.  ?Will get help right away if you are not doing well or get worse. ? ?Pick up stool softner and laxative for home use following surgery while on pain medications. ?Do not remove your dressing. ?The dressing is waterproof--it is OK to take showers. ?Continue to use ice for pain and swelling after surgery. ?Do not use any lotions or creams on the incision until instructed by your surgeon. ?Total Hip Protocol. ? ?

## 2021-10-23 NOTE — Assessment & Plan Note (Signed)
Stable.  Hold Arava for now. ?

## 2021-10-23 NOTE — Transfer of Care (Signed)
Immediate Anesthesia Transfer of Care Note ? ?Patient: Ann Lester ? ?Procedure(s) Performed: TOTAL HIP ARTHROPLASTY ANTERIOR APPROACH (Left: Hip) ? ?Patient Location: PACU ? ?Anesthesia Type:General ? ?Level of Consciousness: drowsy ? ?Airway & Oxygen Therapy: Patient Spontanous Breathing and Patient connected to face mask oxygen ? ?Post-op Assessment: Report given to RN and Post -op Vital signs reviewed and stable ? ?Post vital signs: Reviewed and stable ? ?Last Vitals:  ?Vitals Value Taken Time  ?BP 139/80 10/23/21 1734  ?Temp    ?Pulse 85 10/23/21 1736  ?Resp 13 10/23/21 1736  ?SpO2 100 % 10/23/21 1736  ?Vitals shown include unvalidated device data. ? ?Last Pain:  ?Vitals:  ? 10/23/21 1430  ?TempSrc:   ?PainSc: 5   ?   ? ?Patients Stated Pain Goal: 2 (10/23/21 1430) ? ?Complications: No notable events documented. ?

## 2021-10-23 NOTE — Anesthesia Procedure Notes (Signed)
Anesthesia Regional Block: Peng block  ? ?Pre-Anesthetic Checklist: , timeout performed,  Correct Patient, Correct Site, Correct Laterality,  Correct Procedure, Correct Position, site marked,  Risks and benefits discussed,  Surgical consent,  Pre-op evaluation,  At surgeon's request and post-op pain management ? ?Laterality: Left ? ?Prep: chloraprep     ?  ?Needles:  ?Injection technique: Single-shot ? ?Needle Type: Echogenic Needle   ? ? ?Needle Length: 9cm  ?Needle Gauge: 21  ? ? ? ?Additional Needles: ? ? ?Procedures:,,,, ultrasound used (permanent image in chart),,    ?Narrative:  ?Start time: 10/23/2021 9:03 AM ?End time: 10/23/2021 9:10 AM ?Injection made incrementally with aspirations every 5 mL. ? ?Performed by: Personally  ?Anesthesiologist: Santa Lighter, MD ? ?Additional Notes: ?No pain on injection. No increased resistance to injection. Injection made in 5cc increments.  Good needle visualization.  Patient tolerated procedure well. ? ? ? ? ?

## 2021-10-23 NOTE — Plan of Care (Signed)
  Problem: Education: Goal: Knowledge of General Education information will improve Description Including pain rating scale, medication(s)/side effects and non-pharmacologic comfort measures Outcome: Progressing   

## 2021-10-23 NOTE — Anesthesia Procedure Notes (Signed)
Procedure Name: Intubation ?Date/Time: 10/23/2021 3:25 PM ?Performed by: Lind Covert, CRNA ?Pre-anesthesia Checklist: Patient identified, Emergency Drugs available, Patient being monitored, Timeout performed and Suction available ?Patient Re-evaluated:Patient Re-evaluated prior to induction ?Oxygen Delivery Method: Circle system utilized ?Preoxygenation: Pre-oxygenation with 100% oxygen ?Induction Type: IV induction ?Ventilation: Mask ventilation without difficulty ?Laryngoscope Size: Mac and 4 ?Grade View: Grade II ?Tube type: Oral ?Tube size: 7.0 mm ?Number of attempts: 1 ?Airway Equipment and Method: Stylet ?Placement Confirmation: ETT inserted through vocal cords under direct vision, positive ETCO2 and breath sounds checked- equal and bilateral ?Secured at: 22 cm ?Tube secured with: Tape ?Dental Injury: Teeth and Oropharynx as per pre-operative assessment  ? ? ? ? ?

## 2021-10-23 NOTE — Progress Notes (Signed)
Patient seen and examined in the emergency room.  Admitted early morning hours by nighttime hospitalist with traumatic left hip pain.  She does have history of pulmonary embolism on Xarelto, she tripped over a box and landed on her left side.  Femoral neck fracture.  Last dose of Xarelto 5/10.  Discussed with orthopedic surgery.  Due to unavailability of operating room, patient is going to [facility] for total hip.  Patient will recover and rehab assessment at [facility].  Medically stable to transfer and to undergo total hip.  Patient can likely go back to Xarelto tomorrow so no need to bridge today. ? ?Same-day admit.  No charge visit. ?

## 2021-10-23 NOTE — Assessment & Plan Note (Signed)
Stable.  Continue vitamin D. ?

## 2021-10-23 NOTE — H&P (Signed)
?History and Physical  ? ? ?Ann Lester Mccaul UDJ:[id number] DOB: [birth year] DOA: 10/22/2021 ? ?DOS: the patient was seen and examined on 10/22/2021 ? ?PCP: Abner Greenspan, MD  ? ?Patient coming from: Home ? ?I have personally briefly reviewed patient's old medical records in [facility] ? ?CC: fall ?HPI: ?76 year old female with a history of hypercoagulable disorder, history of PE on Xarelto, history of psoriatic arthritis, obesity, history of palpitations found to the ER today after a fall at home.  She states that she was walking in her hallway.  She tripped over a box.  She landed on the left side.  She had immediate pain of her left hip.  She did not strike her head. ? ?Patient brought to the ER. ? ?X-ray showed femoral neck fracture.. ? ?Last dose of Xarelto was on the evening of 10/21/2021. ? ?Orthopedics has been consulted. ? ?[facility] hospitalist contacted for admission.  ? ?ED Course: xray show left femoral neck hip fracture. ? ?Review of Systems:  ?Review of Systems  ?Constitutional: Negative.   ?HENT: Negative.    ?Eyes: Negative.   ?Respiratory: Negative.    ?Cardiovascular: Negative.   ?Gastrointestinal:  Positive for nausea.  ?     Nausea after IV morphine.  ?Musculoskeletal:  Positive for joint pain.  ?     Left hip pain.  ?Skin: Negative.   ?Neurological: Negative.   ?Endo/Heme/Allergies: Negative.   ?Psychiatric/Behavioral: Negative.    ?All other systems reviewed and are negative. ? ?Past Medical History:  ?Diagnosis Date  ? Coronary artery disease   ? Hyperlipidemia   ? Hypertension   ? Psoriatic arthritis (HCC)   ? ? ?Past Surgical History:  ?Procedure Laterality Date  ? ORIF PATELLA FRACTURE    ? ? ? reports that she has never smoked. She has never used smokeless tobacco. She reports current alcohol use of about 14.0 standard drinks per week. She reports that she does not use drugs. ? ?No Known Allergies ? ?History reviewed. No pertinent family history. ? ?Prior to Admission medications    ?Medication Sig Start Date End Date Taking? Authorizing Provider  ?acetaminophen (TYLENOL) 500 MG tablet Take 1,000 mg by mouth at bedtime.   Yes [provider]  ?bismuth subsalicylate (PEPTO BISMOL) 262 MG/15ML suspension Take 30 mLs by mouth every 6 (six) hours as needed for indigestion or diarrhea or loose stools.   Yes [provider]  ?Cholecalciferol (VITAMIN D3 PO) Take 1 tablet by mouth daily. Unsure of dose   Yes [provider]  ?Coenzyme Q10 (CO Q-10 PO) Take 1 capsule by mouth daily. Unsure of dose   Yes [provider]  ?COSENTYX 150 MG/ML SOSY Inject 150 mg into the skin every 30 (thirty) days. 10/21/21  Yes [provider]  ?Cyanocobalamin (VITAMIN B-12) 1000 MCG SUBL Place 1,000 mcg under the tongue daily.   Yes [provider]  ?DULoxetine (CYMBALTA) 30 MG capsule Take 30 mg by mouth at bedtime. 09/24/21  Yes [provider]  ?fluticasone (FLONASE) 50 MCG/ACT nasal spray Place 1 spray into both nostrils daily as needed for allergies or rhinitis.   Yes [provider]  ?folic acid (FOLVITE) 1 MG tablet Take 1 mg by mouth daily. 09/24/21  Yes [provider]  ?gabapentin (NEURONTIN) 300 MG capsule Take 300 mg by mouth 2 (two) times daily. 07/20/21  Yes [provider]  ?leflunomide (ARAVA) 20 MG tablet Take 20 mg by mouth daily. 09/16/21  Yes [provider]  ?  levothyroxine (SYNTHROID) 88 MCG tablet Take 88 mcg by mouth daily. 10/08/21  Yes [provider]  ?loratadine (CLARITIN) 10 MG tablet Take 10 mg by mouth daily. 06/16/21  Yes [provider]  ?metoprolol tartrate (LOPRESSOR) 25 MG tablet Take 25 mg by mouth daily. 10/05/21  Yes [provider]  ?verapamil (CALAN-SR) 240 MG CR tablet Take 240 mg by mouth daily. 10/12/21  Yes [provider]  ?XARELTO 20 MG TABS tablet Take 20 mg by mouth every evening. 08/20/21  Yes [provider]  ? ? ?Physical Exam: ?Vitals:  ?  10/22/21 2315 10/22/21 2330 10/22/21 2345 10/23/21 0000  ?BP: 119/72 115/70 128/68 (!) 107/58  ?Pulse: 73 73 74 73  ?Resp: 16 13 11 12   ?Temp:      ?TempSrc:      ?SpO2: 91% 94% 98% 96%  ?Weight:      ?Height:      ? ? ?Physical Exam ?Constitutional:   ?   General: She is not in acute distress. ?   Appearance: Normal appearance. She is obese. She is not ill-appearing, toxic-appearing or diaphoretic.  ?HENT:  ?   Head: Normocephalic and atraumatic.  ?   Nose: Nose normal. No rhinorrhea.  ?Eyes:  ?   General: No scleral icterus. ?Cardiovascular:  ?   Rate and Rhythm: Normal rate and regular rhythm.  ?Pulmonary:  ?   Effort: Pulmonary effort is normal. No respiratory distress.  ?   Breath sounds: Normal breath sounds. No wheezing or rales.  ?Abdominal:  ?   General: Bowel sounds are normal. There is no distension.  ?   Tenderness: There is no abdominal tenderness. There is no guarding or rebound.  ?Musculoskeletal:  ?   Right lower leg: No edema.  ?   Left lower leg: No edema.  ?Skin: ?   General: Skin is warm and dry.  ?   Capillary Refill: Capillary refill takes less than 2 seconds.  ?Neurological:  ?   General: No focal deficit present.  ?   Mental Status: She is alert and oriented to person, place, and time.  ?  ? ?Labs on Admission: I have personally reviewed following labs and imaging studies ? ?CBC: ?Recent Labs  ?Lab 10/22/21 ?2248  ?WBC 5.0  ?NEUTROABS 2.8  ?HGB 13.8  ?HCT 42.7  ?MCV 95.1  ?PLT 151  ? ?Basic Metabolic Panel: ?Recent Labs  ?Lab 10/22/21 ?2248  ?NA 143  ?K 3.8  ?CL 112*  ?CO2 22  ?GLUCOSE 74  ?BUN 14  ?CREATININE 0.74  ?CALCIUM 9.3  ? ?GFR: ?Estimated Creatinine Clearance: 61.9 mL/min (by C-G formula based on SCr of 0.74 mg/dL). ?Liver Function Tests: ?No results for input(s): AST, ALT, ALKPHOS, BILITOT, PROT, ALBUMIN in the last 168 hours. ?No results for input(s): LIPASE, AMYLASE in the last 168 hours. ?No results for input(s): AMMONIA in the last 168 hours. ?Coagulation Profile: ?Recent Labs   ?Lab 10/22/21 ?2248  ?INR 1.3*  ? ?Cardiac Enzymes: ?No results for input(s): CKTOTAL, CKMB, CKMBINDEX, TROPONINI, TROPONINIHS in the last 168 hours. ?BNP (last 3 results) ?No results for input(s): PROBNP in the last 8760 hours. ?HbA1C: ?No results for input(s): HGBA1C in the last 72 hours. ?CBG: ?No results for input(s): GLUCAP in the last 168 hours. ?Lipid Profile: ?No results for input(s): CHOL, HDL, LDLCALC, TRIG, CHOLHDL, LDLDIRECT in the last 72 hours. ?Thyroid Function Tests: ?No results for input(s): TSH, T4TOTAL, FREET4, T3FREE, THYROIDAB in the last 72 hours. ?Anemia Panel: ?No results  for input(s): VITAMINB12, FOLATE, FERRITIN, TIBC, IRON, RETICCTPCT in the last 72 hours. ?Urine analysis: ?No results found for: COLORURINE, APPEARANCEUR, LABSPEC, PHURINE, GLUCOSEU, HGBUR, BILIRUBINUR, KETONESUR, PROTEINUR, UROBILINOGEN, NITRITE, LEUKOCYTESUR ? ?Radiological Exams on Admission: I have personally reviewed images ?DG Chest Port 1 View ? ?Result Date: 10/22/2021 ?CLINICAL DATA:  Medical clearance EXAM: PORTABLE CHEST 1 VIEW COMPARISON:  07/11/2021 FINDINGS: The heart size and mediastinal contours are within normal limits. Both lungs are clear. The visualized skeletal structures are unremarkable. IMPRESSION: No active disease. Electronically Signed   By: Jasmine Pang M.D.   On: 10/22/2021 23:15  ? ?DG Hip Unilat With Pelvis 2-3 Views Left ? ?Result Date: 10/22/2021 ?CLINICAL DATA:  Possible deformity EXAM: DG HIP (WITH OR WITHOUT PELVIS) 2-3V LEFT COMPARISON:  None Available. FINDINGS: SI joints are non widened. Pubic symphysis and rami appear intact. Acute minimally displaced subcapital left femoral neck fracture. The femoral head projects in joint. IMPRESSION: Acute left femoral neck fracture. Electronically Signed   By: Jasmine Pang M.D.   On: 10/22/2021 23:16   ? ?EKG: My personal review of EKG shows: NSR ? ? ?Assessment/Plan ?Principal Problem: ?  Closed displaced fracture of left femoral neck  (HCC) ?Active Problems: ?  History of pulmonary embolism ?  Psoriatic arthritis (HCC) ?  Obesity ?  Vitamin D deficiency ?  Hyperlipidemia ?  ? ?Assessment and Plan: ?* Closed displaced fracture of left femoral neck (HCC) ?A

## 2021-10-23 NOTE — Subjective & Objective (Signed)
CC: fall ?HPI: ?76 year old female with a history of hypercoagulable disorder, history of PE on Xarelto, history of psoriatic arthritis, obesity, history of palpitations found to the ER today after a fall at home.  She states that she was walking in her hallway.  She tripped over a box.  She landed on the left side.  She had immediate pain of her left hip.  She did not strike her head. ? ?Patient brought to the ER. ? ?X-ray showed femoral neck fracture.. ? ?Last dose of Xarelto was on the evening of 10/21/2021. ? ?Orthopedics has been consulted. ? ?[facility] hospitalist contacted for admission. ?

## 2021-10-24 DIAGNOSIS — D62 Acute posthemorrhagic anemia: Secondary | ICD-10-CM

## 2021-10-24 DIAGNOSIS — D696 Thrombocytopenia, unspecified: Secondary | ICD-10-CM

## 2021-10-24 DIAGNOSIS — Z86711 Personal history of pulmonary embolism: Secondary | ICD-10-CM | POA: Diagnosis not present

## 2021-10-24 DIAGNOSIS — S72002A Fracture of unspecified part of neck of left femur, initial encounter for closed fracture: Secondary | ICD-10-CM | POA: Diagnosis not present

## 2021-10-24 LAB — BASIC METABOLIC PANEL
Anion gap: 9 (ref 5–15)
BUN: 15 mg/dL (ref 8–23)
CO2: 21 mmol/L — ABNORMAL LOW (ref 22–32)
Calcium: 8 mg/dL — ABNORMAL LOW (ref 8.9–10.3)
Chloride: 107 mmol/L (ref 98–111)
Creatinine, Ser: 0.61 mg/dL (ref 0.44–1.00)
GFR, Estimated: >60 mL/min (ref >60)
Glucose, Bld: 141 mg/dL — ABNORMAL HIGH (ref 70–99)
Potassium: 4.1 mmol/L (ref 3.5–5.1)
Sodium: 137 mmol/L (ref 135–145)

## 2021-10-24 LAB — CBC
HCT: 36.8 % (ref 36.0–46.0)
Hemoglobin: 12.1 g/dL (ref 12.0–15.0)
MCH: 31.3 pg (ref 26.0–34.0)
MCHC: 32.9 g/dL (ref 30.0–36.0)
MCV: 95.3 fL (ref 80.0–100.0)
Platelets: 124 10*3/uL — ABNORMAL LOW (ref 150–400)
RBC: 3.86 MIL/uL — ABNORMAL LOW (ref 3.87–5.11)
RDW: 12.9 % (ref 11.5–15.5)
WBC: 6.8 10*3/uL (ref 4.0–10.5)
nRBC: 0 % (ref 0.0–0.2)

## 2021-10-24 NOTE — Evaluation (Signed)
Physical Therapy Evaluation ?Patient Details ?Name: Ann Lester ?MRN: [id number] ?DOB: 08/26/1945 ?Today's Date: 10/24/2021 ? ?History of Present Illness ? 76 y.o. female who sustained a ground-level fall at home, injuring her left hip.  She had left hip pain and inability to weight-bear.  x-rays revealed a displaced left femoral neck fracture.  Orthopedic consultation was placed for management of her left hip fracture. pt is s/p L DA THA on 10/23/21. PMH: PE, psoriatic arthritis  ?Clinical Impression ? Pt is s/p THA  d/t L femoral neck fx resulting in the deficits listed below (see PT Problem List).  ?Pt is independent at her baseline. Amb ~ 52' with RW and min/guard for safety. Anticipate pt will progress well and likely be ready for d/c home tomorrow  ? Pt will benefit from skilled PT to increase their independence and safety with mobility to allow discharge to the venue listed below.  ?   ?   ? ?Recommendations for follow up therapy are one component of a multi-disciplinary discharge planning process, led by the attending physician.  Recommendations may be updated based on patient status, additional functional criteria and insurance authorization. ? ?Follow Up Recommendations Follow physician's recommendations for discharge plan and follow up therapies ? ?  ?Assistance Recommended at Discharge Intermittent Supervision/Assistance  ?Patient can return home with the following ? A little help with walking and/or transfers;Help with stairs or ramp for entrance;Assist for transportation;Assistance with cooking/housework ? ?  ?Equipment Recommendations Rolling walker (2 wheels)  ?Recommendations for Other Services ?    ?  ?Functional Status Assessment Patient has had a recent decline in their functional status and demonstrates the ability to make significant improvements in function in a reasonable and predictable amount of time.  ? ?  ?Precautions / Restrictions Precautions ?Precautions: Fall ?Precaution Comments: JP  drain L hip ?Restrictions ?Weight Bearing Restrictions: No ?Other Position/Activity Restrictions: WBAT  ? ?  ? ?Mobility ? Bed Mobility ?Overal bed mobility: Needs Assistance ?Bed Mobility: Supine to Sit ?  ?  ?Supine to sit: Min guard, HOB elevated ?  ?  ?General bed mobility comments: for safety ?  ? ?Transfers ?Overall transfer level: Needs assistance ?Equipment used: Rolling walker (2 wheels) ?Transfers: Sit to/from Stand ?Sit to Stand: From elevated surface, Min assist, Min guard ?  ?  ?  ?  ?  ?General transfer comment: verbal cues for hand placement ?  ? ?Ambulation/Gait ?Ambulation/Gait assistance: Min assist, Min guard ?Gait Distance (Feet): 80 Feet ?Assistive device: Rolling walker (2 wheels) ?Gait Pattern/deviations: Step-to pattern, Step-through pattern, Decreased stance time - left ?  ?  ?  ?General Gait Details: verbal cues for sequence initially adn RW position ? ?Stairs ?  ?  ?  ?  ?  ? ?Wheelchair Mobility ?  ? ?Modified Rankin (Stroke Patients Only) ?  ? ?  ? ?Balance Overall balance assessment: Needs assistance ?  ?  ?  ?  ?Standing balance support: During functional activity, No upper extremity supported, Reliant on assistive device for balance ?Standing balance-Leahy Scale: Fair ?Standing balance comment: reliant on UEs for dynamic tasks ?  ?  ?  ?  ?  ?  ?  ?  ?  ?  ?  ?   ? ? ? ?Pertinent Vitals/Pain Pain Assessment ?Pain Assessment: 0-10 ?Pain Score: 5  ?Pain Location: L hip ?Pain Descriptors / Indicators: Aching, Sore, Burning ?Pain Intervention(s): Limited activity within patient's tolerance, Monitored during session, Premedicated before session, Repositioned, Ice applied, RN gave  pain meds during session  ? ? ?Home Living Family/patient expects to be discharged to:: Private residence ?Living Arrangements: Spouse/significant other ?Available Help at Discharge: Family ?Type of Home: House ?Home Access: Stairs to enter ?  ?Entrance Stairs-Number of Steps: 4+ ?  ?Home Layout: Multi-level;Able  to live on main level with bedroom/bathroom ?Home Equipment: None ?   ?  ?Prior Function Prior Level of Function : Independent/Modified Independent ?  ?  ?  ?  ?  ?  ?  ?  ?  ? ? ?Hand Dominance  ?   ? ?  ?Extremity/Trunk Assessment  ? Upper Extremity Assessment ?Upper Extremity Assessment: Overall WFL for tasks assessed ?  ? ?Lower Extremity Assessment ?Lower Extremity Assessment: LLE deficits/detail ?LLE Deficits / Details: ankle WFL, knee and hip grossly 3/5 ?  ? ?   ?Communication  ?    ?Cognition Arousal/Alertness: Awake/alert ?Behavior During Therapy: [facility] for tasks assessed/performed ?Overall Cognitive Status: Within Functional Limits for tasks assessed ?  ?  ?  ?  ?  ?  ?  ?  ?  ?  ?  ?  ?  ?  ?  ?  ?  ?  ?  ? ?  ?General Comments   ? ?  ?Exercises Total Joint Exercises ?Ankle Circles/Pumps: AROM, Both, 10 reps ?Quad Sets: 10 reps, Both, AROM ?Heel Slides: AAROM, Left, 10 reps ?Hip ABduction/ADduction: AROM, AAROM, Left, 10 reps  ? ?Assessment/Plan  ?  ?PT Assessment Patient needs continued PT services  ?PT Problem List Decreased strength;Decreased mobility;Decreased range of motion;Decreased activity tolerance;Pain;Decreased knowledge of use of DME;Decreased balance ? ?   ?  ?PT Treatment Interventions DME instruction;Therapeutic exercise;Gait training;Stair training;Functional mobility training;Therapeutic activities;Patient/family education   ? ?PT Goals (Current goals can be found in the Care Plan section)  ?Acute Rehab PT Goals ?Patient Stated Goal: home ?PT Goal Formulation: With patient ?Time For Goal Achievement: 10/31/21 ?Potential to Achieve Goals: Good ? ?  ?Frequency 7X/week ?  ? ? ?Co-evaluation   ?  ?  ?  ?  ? ? ?  ?AM-PAC PT "6 Clicks" Mobility  ?Outcome Measure Help needed turning from your back to your side while in a flat bed without using bedrails?: None ?Help needed moving from lying on your back to sitting on the side of a flat bed without using bedrails?: None ?Help needed moving to and  from a bed to a chair (including a wheelchair)?: A Little ?Help needed standing up from a chair using your arms (e.g., wheelchair or bedside chair)?: A Little ?Help needed to walk in hospital room?: A Little ?Help needed climbing 3-5 steps with a railing? : A Little ?6 Click Score: 20 ? ?  ?End of Session Equipment Utilized During Treatment: Gait belt ?Activity Tolerance: Patient tolerated treatment well ?Patient left: in chair;with call bell/phone within reach;with chair alarm set;with family/visitor present ?  ?PT Visit Diagnosis: Other abnormalities of gait and mobility (R26.89);Difficulty in walking, not elsewhere classified (R26.2) ?  ? ?Time: 1035-1101 ?PT Time Calculation (min) (ACUTE ONLY): 26 min ? ? ?Charges:   PT Evaluation ?$PT Eval Low Complexity: 1 Low ?PT Treatments ?$Gait Training: 8-22 mins ?  ?   ? ? ?Baxter Flattery, PT ? ?Acute Rehab Dept [facility]) [phone] ?Pager [phone] ? ?10/24/2021 ? ? ?Terence Bart ?10/24/2021, 11:11 AM ? ?

## 2021-10-24 NOTE — Plan of Care (Signed)
Discharge instructions given to the patient including medications, drain care and follow up appointments.  ?

## 2021-10-24 NOTE — Plan of Care (Signed)
  Problem: Education: Goal: Knowledge of General Education information will improve Description: Including pain rating scale, medication(s)/side effects and non-pharmacologic comfort measures Outcome: Progressing   Problem: Activity: Goal: Risk for activity intolerance will decrease Outcome: Progressing   Problem: Pain Managment: Goal: General experience of comfort will improve Outcome: Progressing   

## 2021-10-24 NOTE — Progress Notes (Signed)
? 10/24/21 1500  ?PT Visit Information  ?Last PT Received On 10/24/21 ? ?Pt is progressing well. Reviewed areas as noted below with pt and husband. Pt is ready to d/c with family assist as needed from PT standpoint.   ?Assistance Needed +1  ?History of Present Illness 76 y.o. female who sustained a ground-level fall at home, injuring her left hip.  She had left hip pain and inability to weight-bear.  x-rays revealed a displaced left femoral neck fracture.  Orthopedic consultation was placed for management of her left hip fracture. pt is s/p L DA THA on 10/23/21. PMH: PE, psoriatic arthritis  ?Subjective Data  ?Patient Stated Goal home  ?Precautions  ?Precautions Fall  ?Precaution Comments JP drain L hip  ?Restrictions  ?Weight Bearing Restrictions No  ?Other Position/Activity Restrictions WBAT  ?Pain Assessment  ?Pain Assessment 0-10  ?Pain Score 2  ?Pain Location L hip  ?Pain Descriptors / Indicators Aching;Sore;Burning  ?Pain Intervention(s) Limited activity within patient's tolerance;Monitored during session;Premedicated before session;Repositioned;Ice applied  ?Cognition  ?Arousal/Alertness Awake/alert  ?Behavior During Therapy [facility] for tasks assessed/performed  ?Overall Cognitive Status Within Functional Limits for tasks assessed  ?Bed Mobility  ?Bed Mobility Supine to Sit;Sit to Supine  ?Supine to sit Supervision  ?Sit to supine Supervision  ?General bed mobility comments for safety  ?Transfers  ?Overall transfer level Needs assistance  ?Equipment used Rolling walker (2 wheels)  ?Transfers Sit to/from Stand  ?Sit to Stand Supervision;Min guard  ?General transfer comment verbal cues for hand placement  ?Ambulation/Gait  ?Ambulation/Gait assistance Min guard;Supervision  ?Gait Distance (Feet) 240 Feet  ?Assistive device Rolling walker (2 wheels)  ?Gait Pattern/deviations Step-to pattern;Step-through pattern  ?General Gait Details progression to step through. husband instructed  in use of gait belt to assist if  needed. no LOB, steady gait with RW  ?Stairs Yes  ?Stairs assistance Min guard  ?Stair Management One rail Left;Step to pattern;Sideways  ?Number of Stairs 3  ?General stair comments cues for sequence, step pattern. husband present for stair training  ?Balance  ?Standing balance support During functional activity;No upper extremity supported;Reliant on assistive device for balance  ?Standing balance-Leahy Scale Fair  ?Standing balance comment reliant on UEs for dynamic tasks  ?Exercises  ?Exercises  ?(reviewed THA HEP and progression)  ?Total Joint Exercises  ?Ankle Circles/Pumps AROM;Both;10 reps  ?Quad Sets AROM;Both;5 reps  ?PT - End of Session  ?Equipment Utilized During Treatment Gait belt  ?Activity Tolerance Patient tolerated treatment well  ?Patient left in bed;with call bell/phone within reach;with family/visitor present  ? PT - Assessment/Plan  ?PT Plan Current plan remains appropriate  ?PT Visit Diagnosis Other abnormalities of gait and mobility (R26.89);Difficulty in walking, not elsewhere classified (R26.2)  ?PT Frequency (ACUTE ONLY) 7X/week  ?Follow Up Recommendations Follow physician's recommendations for discharge plan and follow up therapies  ?Assistance recommended at discharge Intermittent Supervision/Assistance  ?Patient can return home with the following A little help with walking and/or transfers;Help with stairs or ramp for entrance;Assist for transportation;Assistance with cooking/housework  ?PT equipment Rolling walker (2 wheels)  ?AM-PAC PT "6 Clicks" Mobility Outcome Measure (Version 2)  ?Help needed turning from your back to your side while in a flat bed without using bedrails? 4  ?Help needed moving from lying on your back to sitting on the side of a flat bed without using bedrails? 4  ?Help needed moving to and from a bed to a chair (including a wheelchair)? 3  ?Help needed standing up from a chair using your  arms (e.g., wheelchair or bedside chair)? 3  ?Help needed to walk in hospital  room? 4  ?Help needed climbing 3-5 steps with a railing?  3  ?6 Click Score 21  ?Consider Recommendation of Discharge To: Home with no services  ?Progressive Mobility  ?What is the highest level of mobility based on the progressive mobility assessment? Level 5 (Walks with assist in room/hall) - Balance while stepping forward/back and can walk in room with assist - Complete  ?Activity Ambulated with assistance in hallway  ?PT Goal Progression  ?Progress towards PT goals Progressing toward goals  ?Acute Rehab PT Goals  ?PT Goal Formulation With patient  ?Time For Goal Achievement 10/31/21  ?Potential to Achieve Goals Good  ?PT Time Calculation  ?PT Start Time (ACUTE ONLY) 1447  ?PT Stop Time (ACUTE ONLY) 1510  ?PT Time Calculation (min) (ACUTE ONLY) 23 min  ?PT General Charges  ?$$ ACUTE PT VISIT 1 Visit  ?PT Treatments  ?$Gait Training 23-37 mins  ? ?

## 2021-10-24 NOTE — Progress Notes (Signed)
?[facility] Progress Note ? ?Patient: Ann Lester     ?AOZ:[id number]  ?DOA: 10/22/2021   ?PCP: Abner Greenspan, MD  ? ?  ?  ?Brief hospital course: ? 76 y/o female with hypercoagulable state and h/o PE on Xarelto fell on 5/11 and was admitted for a left hip fracture. Her last dose of Xarelto was on 5/10. She was admitted to the hospital on 5/12 and underwent surgery on the same date.  ? ? ?Subjective:  ?Mild pain but no other complaints.  ?Assessment and Plan: ?Principal Problem: ?  Closed displaced fracture of left femoral neck (HCC) ?Acute blood loss anemia ?Thrombocytopenia ?- per ortho, Xarelto has been resumed today ?- Today, I evaluated her and noted blood in her JP drain. She had had a drop in Hgb from 13.8 to 12.1.  ?- ortho plans to send her home today as she is ambulating well  ? ?Active Problems: ?  History of pulmonary embolism ?  Psoriatic arthritis (HCC) ?  Obesity ?  Vitamin D deficiency ?  Hyperlipidemia ?  ?  ?DVT prophylaxis:  Xarelto ?  Code Status: Full Code  ?Consultants: Ortho ?Level of Care: Level of care: Med-Surg ?Disposition Plan:  ?Status is: Inpatient ?Remains inpatient appropriate because: hip fracture s/p repair ? ?Objective: ?  ?Vitals:  ? 10/24/21 0543 10/24/21 1110 10/24/21 1116 10/24/21 1414  ?BP: (!) 149/80 (!) 144/71 (!) 141/54 (!) 144/72  ?Pulse: 95 93 86 96  ?Resp: 16 14  18   ?Temp: 98 ?F (36.7 ?C) 97.9 ?F (36.6 ?C)  98 ?F (36.7 ?C)  ?TempSrc: Oral Oral  Oral  ?SpO2: 97% 98% 91% 93%  ?Weight:      ?Height:      ? ?Filed Weights  ? 10/22/21 2204 10/23/21 1430  ?Weight: 75.8 kg 75.8 kg  ? ?Exam: ?General exam: Appears comfortable  ?HEENT: PERRLA, oral mucosa moist, no sclera icterus or thrush ?Respiratory system: Clear to auscultation. Respiratory effort normal. ?Cardiovascular system: S1 & S2 heard, regular rate and rhythm ?Gastrointestinal system: Abdomen soft, non-tender, nondistended. Normal bowel sounds   ?Central nervous system: Alert and oriented. No focal  neurological deficits. ?Extremities: No cyanosis, clubbing - JP drain in left hip has about 5 cc of blood ?Skin: No rashes or ulcers ?Psychiatry:  Mood & affect appropriate.   ? ?Imaging and lab data was personally reviewed ? ? ? CBC: ?Recent Labs  ?Lab 10/22/21 ?2248 10/24/21 ?0320  ?WBC 5.0 6.8  ?NEUTROABS 2.8  --   ?HGB 13.8 12.1  ?HCT 42.7 36.8  ?MCV 95.1 95.3  ?PLT 151 124*  ? ?Basic Metabolic Panel: ?Recent Labs  ?Lab 10/22/21 ?2248 10/24/21 ?0320  ?NA 143 137  ?K 3.8 4.1  ?CL 112* 107  ?CO2 22 21*  ?GLUCOSE 74 141*  ?BUN 14 15  ?CREATININE 0.74 0.61  ?CALCIUM 9.3 8.0*  ? ?GFR: ?Estimated Creatinine Clearance: 61.9 mL/min (by C-G formula based on SCr of 0.61 mg/dL). ? ?Scheduled Meds: ? cholecalciferol  1,000 Units Oral Daily  ? docusate sodium  100 mg Oral BID  ? DULoxetine  30 mg Oral QHS  ? gabapentin  300 mg Oral BID  ? levothyroxine  88 mcg Oral Q0600  ? multivitamin with minerals  1 tablet Oral Daily  ? Ensure Max Protein  11 oz Oral Daily  ? rivaroxaban  10 mg Oral Daily  ? senna  1 tablet Oral BID  ? ?Continuous Infusions: ? methocarbamol (ROBAXIN) IV    ? ? ? LOS:  1 day  ? ?Author: ?Calvert Cantor  ?10/24/2021 3:56 PM ?   ?

## 2021-10-24 NOTE — Discharge Summary (Signed)
? ?In most cases prophylactic antibiotics for Dental procdeures after total joint surgery are not necessary. ? ?Exceptions are as follows: ? ?1. History of prior total joint infection ? ?2. Severely immunocompromised (Organ Transplant, cancer chemotherapy, Rheumatoid biologic ?meds such as [place]) ? ?3. Poorly controlled diabetes (A1C &gt; 8.0, blood glucose over 200) ? ?If you have one of these conditions, contact your surgeon for an antibiotic prescription, prior to your ?dental procedure. Orthopedic Discharge Summary ? ? ? ?  ? ? ?Physician Discharge Summary  ?Patient ID: ?Ann Lester ?MRN: [id number] ?DOB/AGE: 27-Aug-1945 76 y.o. ? ?Admit date: 10/22/2021 ?Discharge date: 10/24/2021  ? ?Procedures:  ?Procedure(s) (LRB): ?TOTAL HIP ARTHROPLASTY ANTERIOR APPROACH (Left) ? ?Attending Physician:  Dr. Lyla Glassing  ? ?Admission Diagnoses:   left femoral neck fracture ? ?Discharge Diagnoses:  left femoral neck fracture ? ? ?Past Medical History:  ?Diagnosis Date  ? Coronary artery disease   ? Hyperlipidemia   ? Hypertension   ? Psoriatic arthritis ([place])   ? ? ?PCP: Marco Collie, MD  ? ?Discharged Condition: good ? ?Hospital Course:  Patient underwent the above stated procedure on 10/22/2021. Patient tolerated the procedure well and brought to the recovery room in good condition and subsequently to the floor. Patient had an uncomplicated hospital course and was stable for discharge. ? ? ?Disposition: Discharge disposition: 01-Home or Self Care ? ? ? ? ? with follow up in 2 weeks ? ? ? Follow-up Information   ? ? Swinteck, Aaron Edelman, MD. Schedule an appointment as soon as possible for a visit in 2 week(s).   ?Specialty: Orthopedic Surgery ?Why: For wound re-check, For suture removal ?Contact information: ?[address] ?[address] ?[place] [address] ?[phone] ? ? ?  ?  ? ?  ?  ? ?  ? ? ?Dental Antibiotics: ? ?In most cases prophylactic antibiotics for Dental procdeures after total joint surgery are not  necessary. ? ?Exceptions are as follows: ? ?1. History of prior total joint infection ? ?2. Severely immunocompromised (Organ Transplant, cancer chemotherapy, Rheumatoid biologic ?meds such as [place]) ? ?3. Poorly controlled diabetes (A1C &gt; 8.0, blood glucose over 200) ? ?If you have one of these conditions, contact your surgeon for an antibiotic prescription, prior to your ?dental procedure. ? ?Discharge Instructions   ? ? Call MD / Call 911   Complete by: As directed ?  ? If you experience chest pain or shortness of breath, CALL 911 and be transported to the hospital emergency room.  If you develope a fever above 101 F, pus (white drainage) or increased drainage or redness at the wound, or calf pain, call your surgeon's office.  ? Constipation Prevention   Complete by: As directed ?  ? Drink plenty of fluids.  Prune juice may be helpful.  You may use a stool softener, such as Colace (over the counter) 100 mg twice a day.  Use MiraLax (over the counter) for constipation as needed.  ? Diet - low sodium heart healthy   Complete by: As directed ?  ? Increase activity slowly as tolerated   Complete by: As directed ?  ? Post-operative opioid taper instructions:   Complete by: As directed ?  ? POST-OPERATIVE OPIOID TAPER INSTRUCTIONS: ?It is important to wean off of your opioid medication as soon as possible. If you do not need pain medication after your surgery it is ok to stop day one. ?Opioids include: ?Codeine, Hydrocodone(Norco, Vicodin), Oxycodone(Percocet, oxycontin) and hydromorphone amongst others.  ?Long term and even  short term use of opiods can cause: ?Increased pain response ?Dependence ?Constipation ?Depression ?Respiratory depression ?And more.  ?Withdrawal symptoms can include ?Flu like symptoms ?Nausea, vomiting ?And more ?Techniques to manage these symptoms ?Hydrate well ?Eat regular healthy meals ?Stay active ?Use relaxation techniques(deep breathing, meditating, yoga) ?Do Not substitute Alcohol to  help with tapering ?If you have been on opioids for less than two weeks and do not have pain than it is ok to stop all together.  ?Plan to wean off of opioids ?This plan should start within one week post op of your joint replacement. ?Maintain the same interval or time between taking each dose and first decrease the dose.  ?Cut the total daily intake of opioids by one tablet each day ?Next start to increase the time between doses. ?The last dose that should be eliminated is the evening dose.  ? ?  ? ?  ? ? ?Allergies as of 10/24/2021   ?No Known Allergies ?  ? ?  ?Medication List  ?  ? ?TAKE these medications   ? ?acetaminophen 500 MG tablet ?Commonly known as: TYLENOL ?Take 1,000 mg by mouth at bedtime. ?  ?bismuth subsalicylate [id number] suspension ?Commonly known as: PEPTO BISMOL ?Take 30 mLs by mouth every 6 (six) hours as needed for indigestion or diarrhea or loose stools. ?  ?CO Q-10 PO ?Take 1 capsule by mouth daily. Unsure of dose ?  ?Cosentyx 150 MG/ML Sosy ?Generic drug: Secukinumab ?Inject 150 mg into the skin every 30 (thirty) days. ?  ?DULoxetine 30 MG capsule ?Commonly known as: CYMBALTA ?Take 30 mg by mouth at bedtime. ?  ?fluticasone 50 MCG/ACT nasal spray ?Commonly known as: FLONASE ?Place 1 spray into both nostrils daily as needed for allergies or rhinitis. ?  ?folic acid 1 MG tablet ?Commonly known as: FOLVITE ?Take 1 mg by mouth daily. ?  ?gabapentin 300 MG capsule ?Commonly known as: NEURONTIN ?Take 300 mg by mouth 2 (two) times daily. ?  ?leflunomide 20 MG tablet ?Commonly known as: ARAVA ?Take 20 mg by mouth daily. ?  ?levothyroxine 88 MCG tablet ?Commonly known as: SYNTHROID ?Take 88 mcg by mouth daily. ?  ?loratadine 10 MG tablet ?Commonly known as: CLARITIN ?Take 10 mg by mouth daily. ?  ?metoprolol tartrate 25 MG tablet ?Commonly known as: LOPRESSOR ?Take 25 mg by mouth daily. ?  ?verapamil 240 MG CR tablet ?Commonly known as: CALAN-SR ?Take 240 mg by mouth daily. ?  ?Vitamin B-12 1000  MCG Subl ?Place 1,000 mcg under the tongue daily. ?  ?VITAMIN D3 PO ?Take 1 tablet by mouth daily. Unsure of dose ?  ?Xarelto 20 MG Tabs tablet ?Generic drug: rivaroxaban ?Take 20 mg by mouth every evening. ?  ? ?  ? ?  ?  ? ? ?  ?Durable Medical Equipment  ?(From admission, onward)  ?  ? ? ?  ? ?  Start     Ordered  ? 10/24/21 0824  For home use only DME Walker rolling  Once       ?Question Answer Comment  ?Walker: With 5 Inch Wheels   ?Patient needs a walker to treat with the following condition History of total hip arthroplasty   ?  ? 10/24/21 0823  ? ?  ?  ? ?  ? ? ? ? ?Signed: ?Ventura Bruns ?10/24/2021, 2:38 PM ? ?[place] is now [employer]  [facility] ?[address], [place], [place], [place] [address] ?Phone: [phone] ?Facebook  Geneticist, molecular  Twitter  ? ?  ?

## 2021-10-24 NOTE — Progress Notes (Signed)
? ?  Subjective: ?1 Day Post-Op Procedure(s) (LRB): ?TOTAL HIP ARTHROPLASTY ANTERIOR APPROACH (Left)  ?Pt c/o moderate pain to left hip today ?Currently requiring oxygen to keep her sats up ?Denies any numbness or tingling distally ?Drain remains in place - produced 80ccs ?Patient reports pain as moderate. ? ?Objective:  ? ?VITALS:   ?Vitals:  ? 10/24/21 0110 10/24/21 0543  ?BP: 134/73 (!) 149/80  ?Pulse: 89 95  ?Resp: 18 16  ?Temp: 97.8 ?F (36.6 ?C) 98 ?F (36.7 ?C)  ?SpO2: 98% 97%  ? ? ?Left hip dressing intact ?No drainage or erythema ?Nv intact distally ?Guarded rom with pain ? ?LABS ?Recent Labs  ?  10/22/21 ?2248 10/24/21 ?0320  ?HGB 13.8 12.1  ?HCT 42.7 36.8  ?WBC 5.0 6.8  ?PLT 151 124*  ? ? ?Recent Labs  ?  10/22/21 ?2248 10/24/21 ?0320  ?NA 143 137  ?K 3.8 4.1  ?BUN 14 15  ?CREATININE 0.74 0.61  ?GLUCOSE 74 141*  ? ? ? ?Assessment/Plan: ?1 Day Post-Op Procedure(s) (LRB): ?TOTAL HIP ARTHROPLASTY ANTERIOR APPROACH (Left) ?PT/OT as able ?Pain management ?Will continue to monitor along with the medical team ?Pulmonary toilet ? ? ? ?Brad Camille Dragan PA-C, MPAS ?[place] is now [employer]  [facility] ?[address], [address], [place], [place] [address] ?Phone: [phone] ?[email or web address] ?Facebook  Engineer, structural  ? ? ? ?  ?

## 2021-10-24 NOTE — Evaluation (Signed)
Occupational Therapy Evaluation ?Patient Details ?Name: Ann Lester ?MRN: [id number] ?DOB: 10/16/1945 ?Today's Date: 10/24/2021 ? ? ?History of Present Illness 76 y.o. female who sustained a ground-level fall at home, injuring her left hip.  She had left hip pain and inability to weight-bear.  x-rays revealed a displaced left femoral neck fracture.  Orthopedic consultation was placed for management of her left hip fracture. pt is s/p L DA THA on 10/23/21. PMH: PE, psoriatic arthritis  ? ?Clinical Impression ?  ?Patient is a 76 year old female who was admitted for above.patient was living at home alone prior level. Patient was noted to have increased pain impacting participation in LB dressing tasks at this time. Patient and husband were educated on AE for LB dressing, and carrying items with RW. Patient and husband verbalized understanding. Patient demonstrated understanding with ability to complete LB dressing tasks sitting EOB with AE with SUP. Patient would continue to benefit from skilled OT services at this time while admitted to address noted deficits in order to improve overall safety and independence in ADLs.  ?  ?   ? ?Recommendations for follow up therapy are one component of a multi-disciplinary discharge planning process, led by the attending physician.  Recommendations may be updated based on patient status, additional functional criteria and insurance authorization.  ? ?Follow Up Recommendations ? No OT follow up  ?  ?Assistance Recommended at Discharge Intermittent Supervision/Assistance  ?Patient can return home with the following A little help with bathing/dressing/bathroom;Assistance with cooking/housework;Direct supervision/assist for financial management;Assist for transportation;Help with stairs or ramp for entrance;Direct supervision/assist for medications management ? ?  ?Functional Status Assessment ? Patient has had a recent decline in their functional status and demonstrates the ability to  make significant improvements in function in a reasonable and predictable amount of time.  ?Equipment Recommendations ? None recommended by OT  ?  ?Recommendations for Other Services   ? ? ?  ?Precautions / Restrictions Precautions ?Precautions: Fall ?Precaution Comments: JP drain L hip ?Restrictions ?Weight Bearing Restrictions: No ?Other Position/Activity Restrictions: WBAT  ? ?  ? ?Mobility Bed Mobility ?Overal bed mobility: Needs Assistance ?Bed Mobility: Supine to Sit ?  ?  ?Supine to sit: Supervision ?  ?  ?General bed mobility comments: for safety ?  ? ?Transfers ?  ?  ?  ?  ?  ?  ?  ?  ?  ?  ?  ? ?  ?Balance Overall balance assessment: Needs assistance ?Sitting-balance support: No upper extremity supported, Feet supported ?Sitting balance-Leahy Scale: Good ?  ?  ?Standing balance support: During functional activity, No upper extremity supported, Reliant on assistive device for balance ?Standing balance-Leahy Scale: Fair ?  ?  ?  ?  ?  ?  ?  ?  ?  ?  ?  ?  ?   ? ?ADL either performed or assessed with clinical judgement  ? ?ADL Overall ADL's : Needs assistance/impaired ?Eating/Feeding: Modified independent;Sitting ?  ?Grooming: Modified independent;Sitting ?  ?Upper Body Bathing: Set up;Sitting ?  ?Lower Body Bathing: Minimal assistance;Sit to/from stand ?Lower Body Bathing Details (indicate cue type and reason): assumed with education provided for AE use. patient and husband reported they would look into AE ?Upper Body Dressing : Set up;Sitting ?  ?Lower Body Dressing: Supervision/safety;Sit to/from stand;Sitting/lateral leans ?Lower Body Dressing Details (indicate cue type and reason): with AE with donning/doffing socks sitting EOB and donning simulated pants EOB with Reacher to increase indpendence. patients husband reported that he would  help her with whatever is needed at home ?Toilet Transfer: Supervision/safety;Ambulation;BSC/3in1 ?Toilet Transfer Details (indicate cue type and reason): patient was  noted to take herself to the bathroom upon exit of room ?Toileting- Clothing Manipulation and Hygiene: Supervision/safety;Sit to/from stand ?  ?  ?  ?Functional mobility during ADLs: Min guard;Rolling walker (2 wheels) ?   ? ? ? ?Vision Patient Visual Report: No change from baseline ?   ?   ?Perception   ?  ?Praxis   ?  ? ?Pertinent Vitals/Pain Pain Assessment ?Pain Assessment: Faces ?Faces Pain Scale: Hurts a little bit ?Pain Location: L hip ?Pain Descriptors / Indicators: Aching, Sore, Burning ?Pain Intervention(s): Limited activity within patient's tolerance, Monitored during session, Premedicated before session, Repositioned, Ice applied  ? ? ? ?Hand Dominance   ?  ?Extremity/Trunk Assessment Upper Extremity Assessment ?Upper Extremity Assessment: Overall WFL for tasks assessed ?  ?Lower Extremity Assessment ?Lower Extremity Assessment: Defer to PT evaluation ?LLE Deficits / Details: ankle WFL, knee and hip grossly 3/5 ?  ?Cervical / Trunk Assessment ?Cervical / Trunk Assessment: Normal ?  ?Communication Communication ?Communication: No difficulties ?  ?Cognition Arousal/Alertness: Awake/alert ?Behavior During Therapy: [facility] for tasks assessed/performed ?Overall Cognitive Status: Within Functional Limits for tasks assessed ?  ?  ?  ?  ?  ?  ?  ?  ?  ?  ?  ?  ?  ?  ?  ?  ?General Comments: patients husband was in room during session as well. ?  ?  ?General Comments    ? ?  ?Exercises   ?  ?Shoulder Instructions    ? ? ?Home Living Family/patient expects to be discharged to:: Private residence ?Living Arrangements: Spouse/significant other ?Available Help at Discharge: Family ?Type of Home: House ?Home Access: Stairs to enter ?Entrance Stairs-Number of Steps: 4+ ?  ?Home Layout: Multi-level;Able to live on main level with bedroom/bathroom ?  ?  ?Bathroom Shower/Tub: Walk-in shower ?  ?  ?  ?  ?Home Equipment: Shower seat ?  ?  ?  ? ?  ?Prior Functioning/Environment Prior Level of Function : Independent/Modified  Independent ?  ?  ?  ?  ?  ?  ?  ?  ?  ? ?  ?  ?OT Problem List: Decreased activity tolerance;Impaired balance (sitting and/or standing);Decreased safety awareness;Decreased knowledge of precautions;Decreased knowledge of use of DME or AE ?  ?   ?OT Treatment/Interventions: Self-care/ADL training;Neuromuscular education;Energy conservation;DME and/or AE instruction;Therapeutic activities;Balance training;Patient/family education  ?  ?OT Goals(Current goals can be found in the care plan section) Acute Rehab OT Goals ?Patient Stated Goal: to go home ?OT Goal Formulation: With patient/family ?Time For Goal Achievement: 11/07/21 ?Potential to Achieve Goals: Good  ?OT Frequency: Min 1X/week ?  ? ?Co-evaluation   ?  ?  ?  ?  ? ?  ?AM-PAC OT "6 Clicks" Daily Activity     ?Outcome Measure Help from another person eating meals?: None ?Help from another person taking care of personal grooming?: None ?Help from another person toileting, which includes using toliet, bedpan, or urinal?: A Little ?Help from another person bathing (including washing, rinsing, drying)?: A Little ?Help from another person to put on and taking off regular upper body clothing?: A Little ?Help from another person to put on and taking off regular lower body clothing?: A Little ?6 Click Score: 20 ?  ?End of Session Equipment Utilized During Treatment: Gait belt;Rolling walker (2 wheels) ?Nurse Communication: Mobility status ? ?Activity Tolerance: Patient tolerated  treatment well ?Patient left: in bed;with call bell/phone within reach;with family/visitor present ? ?OT Visit Diagnosis: Unsteadiness on feet (R26.81);Other abnormalities of gait and mobility (R26.89)  ?              ?Time: 6283-6629 ?OT Time Calculation (min): 31 min ?Charges:  OT General Charges ?$OT Visit: 1 Visit ?OT Evaluation ?$OT Eval Low Complexity: 1 Low ?OT Treatments ?$Self Care/Home Management : 8-22 mins ? ?Tosha Belgarde OTR/L, MS ?Acute Rehabilitation Department ?Office#  [phone] ?Pager# [phone] ? ? ?Barnabas Lister Annahi Short ?10/24/2021, 1:55 PM ?

## 2021-10-24 NOTE — TOC Transition Note (Signed)
Transition of Care (TOC) - CM/SW Discharge Note ? ? ?Patient Details  ?Name: Zionna Homewood Mohler ?MRN: [id number] ?Date of Birth: 1946-03-15 ? ?Transition of Care (TOC) CM/SW Contact:  ?Darleene Cleaver, LCSW ?Phone Number: ?10/24/2021, 12:15 PM ? ? ?Clinical Narrative:    ? ?CSW spoke to patient's daughter, patient would like a rolling walker.  CSW contacted [employer], and they can deliver walker to patient's room before she discharges.  CSW to sign off, please reconsult with other TOC needs. ? ? ?Final next level of care: Home/Self Care ?Barriers to Discharge: Barriers Resolved ? ? ?Patient Goals and CMS Choice ?Patient states their goals for this hospitalization and ongoing recovery are:: To return back home. ?CMS Medicare.gov Compare Post Acute Care list provided to:: Patient Represenative (must comment) ?Choice offered to / list presented to : Adult Children ? ?Discharge Placement ?  ?           ?  ?  ?  ?  ? ?Discharge Plan and Services ?  ?  ?           ?DME Arranged: Walker rolling ?DME Agency: [employer] ?Date DME Agency Contacted: 10/24/21 ?Time DME Agency Contacted: 1214 ?Representative spoke with at DME Agency: Leavy Cella ?  ?  ?  ?  ?  ? ?Social Determinants of Health (SDOH) Interventions ?  ? ? ?Readmission Risk Interventions ?   ? View : No data to display.  ?  ?  ?  ? ? ? ? ? ?

## 2021-10-26 ENCOUNTER — Encounter (HOSPITAL_COMMUNITY): Payer: Self-pay | Admitting: Orthopedic Surgery

## 2021-10-30 DIAGNOSIS — J029 Acute pharyngitis, unspecified: Secondary | ICD-10-CM | POA: Diagnosis not present

## 2021-10-30 DIAGNOSIS — Z6826 Body mass index (BMI) 26.0-26.9, adult: Secondary | ICD-10-CM | POA: Diagnosis not present

## 2021-10-30 DIAGNOSIS — Z20822 Contact with and (suspected) exposure to covid-19: Secondary | ICD-10-CM | POA: Diagnosis not present

## 2021-11-10 DIAGNOSIS — S72032D Displaced midcervical fracture of left femur, subsequent encounter for closed fracture with routine healing: Secondary | ICD-10-CM | POA: Diagnosis not present

## 2021-11-12 DIAGNOSIS — E1122 Type 2 diabetes mellitus with diabetic chronic kidney disease: Secondary | ICD-10-CM | POA: Diagnosis not present

## 2021-11-12 DIAGNOSIS — E669 Obesity, unspecified: Secondary | ICD-10-CM | POA: Diagnosis not present

## 2021-12-08 DIAGNOSIS — S72032D Displaced midcervical fracture of left femur, subsequent encounter for closed fracture with routine healing: Secondary | ICD-10-CM | POA: Diagnosis not present

## 2021-12-12 DIAGNOSIS — E1122 Type 2 diabetes mellitus with diabetic chronic kidney disease: Secondary | ICD-10-CM | POA: Diagnosis not present

## 2021-12-12 DIAGNOSIS — E669 Obesity, unspecified: Secondary | ICD-10-CM | POA: Diagnosis not present

## 2021-12-28 DIAGNOSIS — E1122 Type 2 diabetes mellitus with diabetic chronic kidney disease: Secondary | ICD-10-CM | POA: Diagnosis not present

## 2021-12-28 DIAGNOSIS — E039 Hypothyroidism, unspecified: Secondary | ICD-10-CM | POA: Diagnosis not present

## 2021-12-28 DIAGNOSIS — E1169 Type 2 diabetes mellitus with other specified complication: Secondary | ICD-10-CM | POA: Diagnosis not present

## 2021-12-30 DIAGNOSIS — M8580 Other specified disorders of bone density and structure, unspecified site: Secondary | ICD-10-CM | POA: Diagnosis not present

## 2021-12-30 DIAGNOSIS — M79676 Pain in unspecified toe(s): Secondary | ICD-10-CM | POA: Diagnosis not present

## 2021-12-30 DIAGNOSIS — E669 Obesity, unspecified: Secondary | ICD-10-CM | POA: Diagnosis not present

## 2021-12-30 DIAGNOSIS — Z79899 Other long term (current) drug therapy: Secondary | ICD-10-CM | POA: Diagnosis not present

## 2021-12-30 DIAGNOSIS — L409 Psoriasis, unspecified: Secondary | ICD-10-CM | POA: Diagnosis not present

## 2021-12-30 DIAGNOSIS — M810 Age-related osteoporosis without current pathological fracture: Secondary | ICD-10-CM | POA: Diagnosis not present

## 2021-12-30 DIAGNOSIS — M7989 Other specified soft tissue disorders: Secondary | ICD-10-CM | POA: Diagnosis not present

## 2021-12-30 DIAGNOSIS — M1990 Unspecified osteoarthritis, unspecified site: Secondary | ICD-10-CM | POA: Diagnosis not present

## 2021-12-30 DIAGNOSIS — L4050 Arthropathic psoriasis, unspecified: Secondary | ICD-10-CM | POA: Diagnosis not present

## 2022-01-04 DIAGNOSIS — E1122 Type 2 diabetes mellitus with diabetic chronic kidney disease: Secondary | ICD-10-CM | POA: Diagnosis not present

## 2022-01-04 DIAGNOSIS — Z139 Encounter for screening, unspecified: Secondary | ICD-10-CM | POA: Diagnosis not present

## 2022-01-04 DIAGNOSIS — E039 Hypothyroidism, unspecified: Secondary | ICD-10-CM | POA: Diagnosis not present

## 2022-01-04 DIAGNOSIS — N182 Chronic kidney disease, stage 2 (mild): Secondary | ICD-10-CM | POA: Diagnosis not present

## 2022-01-04 DIAGNOSIS — Z1339 Encounter for screening examination for other mental health and behavioral disorders: Secondary | ICD-10-CM | POA: Diagnosis not present

## 2022-01-04 DIAGNOSIS — Z1331 Encounter for screening for depression: Secondary | ICD-10-CM | POA: Diagnosis not present

## 2022-01-04 DIAGNOSIS — Z0000 Encounter for general adult medical examination without abnormal findings: Secondary | ICD-10-CM | POA: Diagnosis not present

## 2022-01-04 DIAGNOSIS — Z1389 Encounter for screening for other disorder: Secondary | ICD-10-CM | POA: Diagnosis not present

## 2022-01-04 DIAGNOSIS — I129 Hypertensive chronic kidney disease with stage 1 through stage 4 chronic kidney disease, or unspecified chronic kidney disease: Secondary | ICD-10-CM | POA: Diagnosis not present

## 2022-01-12 DIAGNOSIS — E669 Obesity, unspecified: Secondary | ICD-10-CM | POA: Diagnosis not present

## 2022-01-12 DIAGNOSIS — E1122 Type 2 diabetes mellitus with diabetic chronic kidney disease: Secondary | ICD-10-CM | POA: Diagnosis not present

## 2022-01-20 DIAGNOSIS — M810 Age-related osteoporosis without current pathological fracture: Secondary | ICD-10-CM | POA: Diagnosis not present

## 2022-02-08 DIAGNOSIS — R002 Palpitations: Secondary | ICD-10-CM | POA: Diagnosis not present

## 2022-02-08 DIAGNOSIS — I2510 Atherosclerotic heart disease of native coronary artery without angina pectoris: Secondary | ICD-10-CM | POA: Diagnosis not present

## 2022-02-08 DIAGNOSIS — R079 Chest pain, unspecified: Secondary | ICD-10-CM | POA: Diagnosis not present

## 2022-02-08 DIAGNOSIS — J9811 Atelectasis: Secondary | ICD-10-CM | POA: Diagnosis not present

## 2022-02-08 DIAGNOSIS — R000 Tachycardia, unspecified: Secondary | ICD-10-CM | POA: Diagnosis not present

## 2022-02-08 DIAGNOSIS — Z6825 Body mass index (BMI) 25.0-25.9, adult: Secondary | ICD-10-CM | POA: Diagnosis not present

## 2022-02-08 DIAGNOSIS — S90229A Contusion of unspecified lesser toe(s) with damage to nail, initial encounter: Secondary | ICD-10-CM | POA: Diagnosis not present

## 2022-02-12 DIAGNOSIS — E669 Obesity, unspecified: Secondary | ICD-10-CM | POA: Diagnosis not present

## 2022-02-12 DIAGNOSIS — E1122 Type 2 diabetes mellitus with diabetic chronic kidney disease: Secondary | ICD-10-CM | POA: Diagnosis not present

## 2022-02-16 DIAGNOSIS — I129 Hypertensive chronic kidney disease with stage 1 through stage 4 chronic kidney disease, or unspecified chronic kidney disease: Secondary | ICD-10-CM | POA: Diagnosis not present

## 2022-02-16 DIAGNOSIS — E1122 Type 2 diabetes mellitus with diabetic chronic kidney disease: Secondary | ICD-10-CM | POA: Diagnosis not present

## 2022-02-16 DIAGNOSIS — N182 Chronic kidney disease, stage 2 (mild): Secondary | ICD-10-CM | POA: Diagnosis not present

## 2022-02-16 DIAGNOSIS — R002 Palpitations: Secondary | ICD-10-CM | POA: Diagnosis not present

## 2022-02-16 DIAGNOSIS — Z6825 Body mass index (BMI) 25.0-25.9, adult: Secondary | ICD-10-CM | POA: Diagnosis not present

## 2022-03-14 DIAGNOSIS — E669 Obesity, unspecified: Secondary | ICD-10-CM | POA: Diagnosis not present

## 2022-03-14 DIAGNOSIS — E1122 Type 2 diabetes mellitus with diabetic chronic kidney disease: Secondary | ICD-10-CM | POA: Diagnosis not present

## 2022-03-30 DIAGNOSIS — I472 Ventricular tachycardia, unspecified: Secondary | ICD-10-CM | POA: Diagnosis not present

## 2022-03-30 DIAGNOSIS — I471 Supraventricular tachycardia, unspecified: Secondary | ICD-10-CM | POA: Diagnosis not present

## 2022-03-30 DIAGNOSIS — Z6825 Body mass index (BMI) 25.0-25.9, adult: Secondary | ICD-10-CM | POA: Diagnosis not present

## 2022-04-01 DIAGNOSIS — E669 Obesity, unspecified: Secondary | ICD-10-CM | POA: Diagnosis not present

## 2022-04-01 DIAGNOSIS — Z79899 Other long term (current) drug therapy: Secondary | ICD-10-CM | POA: Diagnosis not present

## 2022-04-01 DIAGNOSIS — L409 Psoriasis, unspecified: Secondary | ICD-10-CM | POA: Diagnosis not present

## 2022-04-01 DIAGNOSIS — M810 Age-related osteoporosis without current pathological fracture: Secondary | ICD-10-CM | POA: Diagnosis not present

## 2022-04-01 DIAGNOSIS — L4050 Arthropathic psoriasis, unspecified: Secondary | ICD-10-CM | POA: Diagnosis not present

## 2022-04-01 DIAGNOSIS — M1990 Unspecified osteoarthritis, unspecified site: Secondary | ICD-10-CM | POA: Diagnosis not present

## 2022-04-01 DIAGNOSIS — M79676 Pain in unspecified toe(s): Secondary | ICD-10-CM | POA: Diagnosis not present

## 2022-04-13 ENCOUNTER — Encounter: Payer: Self-pay | Admitting: Cardiology

## 2022-04-13 ENCOUNTER — Encounter: Payer: Self-pay | Admitting: *Deleted

## 2022-04-13 DIAGNOSIS — R002 Palpitations: Secondary | ICD-10-CM | POA: Insufficient documentation

## 2022-04-13 DIAGNOSIS — I2510 Atherosclerotic heart disease of native coronary artery without angina pectoris: Secondary | ICD-10-CM | POA: Insufficient documentation

## 2022-04-13 DIAGNOSIS — E1151 Type 2 diabetes mellitus with diabetic peripheral angiopathy without gangrene: Secondary | ICD-10-CM | POA: Insufficient documentation

## 2022-04-13 DIAGNOSIS — I10 Essential (primary) hypertension: Secondary | ICD-10-CM | POA: Insufficient documentation

## 2022-04-13 DIAGNOSIS — M810 Age-related osteoporosis without current pathological fracture: Secondary | ICD-10-CM | POA: Insufficient documentation

## 2022-04-13 HISTORY — DX: Age-related osteoporosis without current pathological fracture: M81.0

## 2022-04-13 HISTORY — DX: Type 2 diabetes mellitus with diabetic peripheral angiopathy without gangrene: E11.51

## 2022-04-13 NOTE — Progress Notes (Unsigned)
Cardiology Office Note:    Date:  04/14/2022   ID:  Barkley Bruns, DOB [birth year], MRN [id number]  PCP:  Marco Collie, MD  Cardiologist:  Shirlee More, MD   Referring MD: Marco Collie, MD  ASSESSMENT:    1. Palpitations   2. Angina pectoris ([place])   3. Coronary artery calcification seen on CT scan    PLAN:    In order of problems listed above:  Is a tough case I think clinically she has an atrial tachyarrhythmia but the problem is we have not captured the episode the options would include an implanted loop recorder or consumer device  especially Apple Watch device.  She will sign up with my chart and if she captures an episode assented to me if we document atrial tachyarrhythmias I think she should be referred to EP as this is life disruptive and continues on beta-blocker.  She will avoid over-the-counter proarrhythmic drugs She gets angina with rapid heart rate she had a perfusion study in the last year with no ischemia and I do not think she needs coronary angiography at this time. Continue her statin with coronary and aortic atherosclerosis  Next appointment plan to see her in 3 months or sooner if we capture an episode   Medication Adjustments/Labs and Tests Ordered: Current medicines are reviewed at length with the patient today.  Concerns regarding medicines are outlined above.  Orders Placed This Encounter  Procedures   EKG 12-Lead   No orders of the defined types were placed in this encounter.    Chief Complaint  Patient presents with   SVT    History of Present Illness:    Ann Lester is a 76 y.o. female who is being seen today for the evaluation of abnormal ZIO monitor at the request of Marco Collie, MD.  She had an echocardiogram [facility] 07/11/2021 EF 60 to 65% impaired relaxation mild left and right atrial enlargement trace to mild aortic mitral and tricuspid regurgitation.  She was seen in [facility] ED 02/08/2022 with complaints of  palpitation but apparently at some point there was also chest discomfort.  The ED relates she has a history of supraventricular tachycardia as well as factor V mutation with long-term anticoagulation.  Hemoglobin was normal 13 3 creatinine 0.50 potassium 3.7 troponin undetectable TSH was normal EKG was described as sinus rhythm and normal.  Chest CTA was performed described as having severe coronary artery calcification no finding of pulmonary embolism. I independently reviewed the EKG which showed sinus rhythm with first-degree AV block and was otherwise normal. She had a myocardial perfusion study done 07/11/2021 with no evidence of ischemia and a normal ejection fraction  She previously was seen by my partner Dr. Geraldo Pitter around 2005 she had an event they called a heart attack she had a heart catheterization she thinks was normal or near normal. She has a long history of intermittent rapid heart rhythm she feels weak she has to rest and last for a few minutes and associated with chest tightness She is worn multiple monitors in the past and she tells me the last one she was told she has ventricular tachycardia She uses over-the-counter allergy medications that are proarrhythmic Outside of the episodes she has no edema shortness of breath chest pain palpitation or syncope She has coronary artery calcification and had a normal myocardial perfusion study and she is on high intensity statin and her lipids are ideal 12/28/2021 cholesterol 217 LDL 82 A1c 5.2 hemoglobin 12.1  creatinine 0.59 potassium 3.9 Her EKG is sinus rhythm and normal  Fortunately before leaving the office we received her event monitor is performed for 14 days she had 5 symptomatic events with capturing significant arrhythmia the incidence of ventricular and supraventricular ectopy was rare there was one 7 beat run of wider QRS representing either SVT with aberrant  conduction are brief run of PVCs. Regardless this is not a high risk  monitor I do not think we captured her clinical event. Past Medical History:  Diagnosis Date   Controlled diabetes mellitus with peripheral circulatory disorder (HCC) 04/13/2022   Coronary artery disease    History of pulmonary embolism 05/16/2015   Hyperlipidemia    Hypertension    Obesity 10/23/2021   Osteoporosis 04/13/2022   Psoriatic arthritis (HCC)    Vitamin D deficiency 10/23/2021    Past Surgical History:  Procedure Laterality Date   APPENDECTOMY  04/06/2011   CERVICAL DISC SURGERY     CESAREAN SECTION     X2   ORIF PATELLA FRACTURE  04/06/2011   SHOULDER ARTHROSCOPY WITH ROTATOR CUFF REPAIR Right 02/02/2021   TOTAL HIP ARTHROPLASTY Left 10/23/2021   Procedure: TOTAL HIP ARTHROPLASTY ANTERIOR APPROACH;  Surgeon: Samson Frederic, MD;  Location: [facility];  Service: Orthopedics;  Laterality: Left;    Current Medications: Current Meds  Medication Sig   acetaminophen (TYLENOL) 500 MG tablet Take 1,000 mg by mouth at bedtime.   amoxicillin (AMOXIL) 500 MG tablet Take 2,000 mg by mouth as directed. 1-2 Hours prior to dental work   bismuth subsalicylate (PEPTO BISMOL) 262 MG/15ML suspension Take 30 mLs by mouth every 6 (six) hours as needed for indigestion or diarrhea or loose stools.   Calcium Carbonate (CALCIUM 500 PO) Take 1,000 mg by mouth 2 (two) times daily.   cholecalciferol (VITAMIN D3) 25 MCG (1000 UNIT) tablet Take 1,000 Units by mouth daily.   Coenzyme Q10 (CO Q-10 PO) Take 1 capsule by mouth daily. Unsure of dose   COSENTYX 150 MG/ML SOSY Inject 150 mg into the skin every 30 (thirty) days.   folic acid (FOLVITE) 1 MG tablet Take 1 mg by mouth daily.   Huperzine Serrate A 1 % POWD Take 2 capsules by mouth daily.   leflunomide (ARAVA) 20 MG tablet Take 20 mg by mouth daily.   levothyroxine (SYNTHROID) 88 MCG tablet Take 88 mcg by mouth daily.   loratadine (CLARITIN) 10 MG tablet Take 10 mg by mouth daily.   metoprolol tartrate (LOPRESSOR) 25 MG tablet Take 25 mg by  mouth daily.   Milk Thistle 1000 MG CAPS Take 1,000 mg by mouth daily.   rosuvastatin (CRESTOR) 20 MG tablet Take 20 mg by mouth 2 (two) times a week.   verapamil (CALAN-SR) 240 MG CR tablet Take 240 mg by mouth daily.   XARELTO 20 MG TABS tablet Take 20 mg by mouth every evening.     Allergies:   Patient has no known allergies.   Social History   Socioeconomic History   Marital status: Married    Spouse name: Not on file   Number of children: Not on file   Years of education: Not on file   Highest education level: Not on file  Occupational History   Not on file  Tobacco Use   Smoking status: Former    Types: Cigarettes    Passive exposure: Past   Smokeless tobacco: Never  Vaping Use   Vaping Use: Never used  Substance and Sexual Activity  Alcohol use: Yes    Alcohol/week: 14.0 standard drinks of alcohol    Types: 14 Standard drinks or equivalent per week   Drug use: Never   Sexual activity: Not on file  Other Topics Concern   Not on file  Social History Narrative   Not on file   Social Determinants of Health   Financial Resource Strain: Not on file  Food Insecurity: Not on file  Transportation Needs: Not on file  Physical Activity: Not on file  Stress: Not on file  Social Connections: Not on file     Family History: The patient's family history includes Hypertension in her father; Lung cancer in her mother.  ROS:   ROS Please see the history of present illness.     All other systems reviewed and are negative.  EKGs/Labs/Other Studies Reviewed:    The following studies were reviewed today:   EKG:  EKG is  ordered today.  The ekg ordered today is personally reviewed and demonstrates sinus rhythm and is normal  Recent Labs: 10/24/2021: BUN 15; Creatinine, Ser 0.61; Hemoglobin 12.1; Platelets 124; Potassium 4.1; Sodium 137    Physical Exam:    VS:  BP 128/77 (BP Location: Left Arm, Patient Position: Sitting, Cuff Size: Normal)   Pulse 66   Ht 5\' 5"   (1.651 m)   Wt 151 lb 6.4 oz (68.7 kg)   SpO2 98%   BMI 25.19 kg/m     Wt Readings from Last 3 Encounters:  04/14/22 151 lb 6.4 oz (68.7 kg)  03/30/22 155 lb (70.3 kg)  10/23/21 167 lb (75.8 kg)     GEN: Appears her age healthy well nourished, well developed in no acute distress HEENT: Normal NECK: No JVD; No carotid bruits LYMPHATICS: No lymphadenopathy CARDIAC: Interviewed and RRR, no murmurs, rubs, gallops RESPIRATORY:  Clear to auscultation without rales, wheezing or rhonchi  ABDOMEN: Soft, non-tender, non-distended MUSCULOSKELETAL:  No edema; No deformity  SKIN: Warm and dry NEUROLOGIC:  Alert and oriented x 3 PSYCHIATRIC:  Normal affect     Signed, 12/23/21, MD  04/14/2022 9:53 AM    [facility]

## 2022-04-14 ENCOUNTER — Encounter: Payer: Self-pay | Admitting: Cardiology

## 2022-04-14 ENCOUNTER — Ambulatory Visit: Payer: Medicare PPO | Attending: Cardiology | Admitting: Cardiology

## 2022-04-14 VITALS — BP 128/77 | HR 66 | Ht 65.0 in | Wt 151.4 lb

## 2022-04-14 DIAGNOSIS — R002 Palpitations: Secondary | ICD-10-CM

## 2022-04-14 DIAGNOSIS — I209 Angina pectoris, unspecified: Secondary | ICD-10-CM

## 2022-04-14 DIAGNOSIS — Z87891 Personal history of nicotine dependence: Secondary | ICD-10-CM

## 2022-04-14 DIAGNOSIS — I25119 Atherosclerotic heart disease of native coronary artery with unspecified angina pectoris: Secondary | ICD-10-CM | POA: Diagnosis not present

## 2022-04-14 DIAGNOSIS — E669 Obesity, unspecified: Secondary | ICD-10-CM | POA: Diagnosis not present

## 2022-04-14 DIAGNOSIS — I2510 Atherosclerotic heart disease of native coronary artery without angina pectoris: Secondary | ICD-10-CM | POA: Diagnosis not present

## 2022-04-14 DIAGNOSIS — E1122 Type 2 diabetes mellitus with diabetic chronic kidney disease: Secondary | ICD-10-CM | POA: Diagnosis not present

## 2022-04-14 NOTE — Patient Instructions (Signed)
1. Avoid all over-the-counter antihistamines except Claritin/Loratadine and Zyrtec/Cetrizine. 2. Avoid all combination including cold sinus allergies flu decongestant and sleep medications 3. You can use Robitussin DM Mucinex and Mucinex DM for cough. 4. can use Tylenol aspirin ibuprofen and naproxen but no combinations such as sleep or sinus.   Please get an Apple watch to monitor heart beat  Medication Instructions:  Your physician recommends that you continue on your current medications as directed. Please refer to the Current Medication list given to you today.  *If you need a refill on your cardiac medications before your next appointment, please call your pharmacy*   Lab Work: None Ordered If you have labs (blood work) drawn today and your tests are completely normal, you will receive your results only by: [place] (if you have MyChart) OR A paper copy in the mail If you have any lab test that is abnormal or we need to change your treatment, we will call you to review the results.   Testing/Procedures: None Ordered   Follow-Up: At [facility], you and your health needs are our priority.  As part of our continuing mission to provide you with exceptional heart care, we have created designated Provider Care Teams.  These Care Teams include your primary Cardiologist (physician) and Advanced Practice Providers (APPs -  Physician Assistants and Nurse Practitioners) who all work together to provide you with the care you need, when you need it.  We recommend signing up for the patient portal called "MyChart".  Sign up information is provided on this After Visit Summary.  MyChart is used to connect with patients for Virtual Visits (Telemedicine).  Patients are able to view lab/test results, encounter notes, upcoming appointments, etc.  Non-urgent messages can be sent to your provider as well.   To learn more about what you can do with MyChart, go to [email or web address].     Your next appointment:   3 month(s)  The format for your next appointment:   In Person  Provider:   Shirlee More, MD    Other Instructions NA

## 2022-05-05 DIAGNOSIS — E039 Hypothyroidism, unspecified: Secondary | ICD-10-CM | POA: Diagnosis not present

## 2022-05-05 DIAGNOSIS — E1169 Type 2 diabetes mellitus with other specified complication: Secondary | ICD-10-CM | POA: Diagnosis not present

## 2022-05-05 DIAGNOSIS — E1122 Type 2 diabetes mellitus with diabetic chronic kidney disease: Secondary | ICD-10-CM | POA: Diagnosis not present

## 2022-05-13 DIAGNOSIS — I129 Hypertensive chronic kidney disease with stage 1 through stage 4 chronic kidney disease, or unspecified chronic kidney disease: Secondary | ICD-10-CM | POA: Diagnosis not present

## 2022-05-13 DIAGNOSIS — E785 Hyperlipidemia, unspecified: Secondary | ICD-10-CM | POA: Diagnosis not present

## 2022-05-13 DIAGNOSIS — N182 Chronic kidney disease, stage 2 (mild): Secondary | ICD-10-CM | POA: Diagnosis not present

## 2022-05-13 DIAGNOSIS — Z139 Encounter for screening, unspecified: Secondary | ICD-10-CM | POA: Diagnosis not present

## 2022-05-13 DIAGNOSIS — E1122 Type 2 diabetes mellitus with diabetic chronic kidney disease: Secondary | ICD-10-CM | POA: Diagnosis not present

## 2022-05-13 DIAGNOSIS — Z6824 Body mass index (BMI) 24.0-24.9, adult: Secondary | ICD-10-CM | POA: Diagnosis not present

## 2022-05-13 DIAGNOSIS — E039 Hypothyroidism, unspecified: Secondary | ICD-10-CM | POA: Diagnosis not present

## 2022-05-14 DIAGNOSIS — E1122 Type 2 diabetes mellitus with diabetic chronic kidney disease: Secondary | ICD-10-CM | POA: Diagnosis not present

## 2022-05-14 DIAGNOSIS — E669 Obesity, unspecified: Secondary | ICD-10-CM | POA: Diagnosis not present

## 2022-06-01 DIAGNOSIS — Z9181 History of falling: Secondary | ICD-10-CM | POA: Diagnosis not present

## 2022-06-01 DIAGNOSIS — Z20822 Contact with and (suspected) exposure to covid-19: Secondary | ICD-10-CM | POA: Diagnosis not present

## 2022-06-01 DIAGNOSIS — R6883 Chills (without fever): Secondary | ICD-10-CM | POA: Diagnosis not present

## 2022-06-01 DIAGNOSIS — Z6824 Body mass index (BMI) 24.0-24.9, adult: Secondary | ICD-10-CM | POA: Diagnosis not present

## 2022-06-14 DIAGNOSIS — E1122 Type 2 diabetes mellitus with diabetic chronic kidney disease: Secondary | ICD-10-CM | POA: Diagnosis not present

## 2022-06-14 DIAGNOSIS — E669 Obesity, unspecified: Secondary | ICD-10-CM | POA: Diagnosis not present

## 2022-07-01 DIAGNOSIS — M25512 Pain in left shoulder: Secondary | ICD-10-CM | POA: Diagnosis not present

## 2022-07-01 DIAGNOSIS — M79676 Pain in unspecified toe(s): Secondary | ICD-10-CM | POA: Diagnosis not present

## 2022-07-01 DIAGNOSIS — M810 Age-related osteoporosis without current pathological fracture: Secondary | ICD-10-CM | POA: Diagnosis not present

## 2022-07-01 DIAGNOSIS — L409 Psoriasis, unspecified: Secondary | ICD-10-CM | POA: Diagnosis not present

## 2022-07-01 DIAGNOSIS — L4050 Arthropathic psoriasis, unspecified: Secondary | ICD-10-CM | POA: Diagnosis not present

## 2022-07-01 DIAGNOSIS — M7582 Other shoulder lesions, left shoulder: Secondary | ICD-10-CM | POA: Diagnosis not present

## 2022-07-01 DIAGNOSIS — Z79899 Other long term (current) drug therapy: Secondary | ICD-10-CM | POA: Diagnosis not present

## 2022-07-01 DIAGNOSIS — M1990 Unspecified osteoarthritis, unspecified site: Secondary | ICD-10-CM | POA: Diagnosis not present

## 2022-07-06 DIAGNOSIS — H43813 Vitreous degeneration, bilateral: Secondary | ICD-10-CM | POA: Diagnosis not present

## 2022-07-12 DIAGNOSIS — M25512 Pain in left shoulder: Secondary | ICD-10-CM | POA: Diagnosis not present

## 2022-07-14 NOTE — Progress Notes (Unsigned)
Cardiology Office Note:    Date:  07/15/2022   ID:  Ann Lester, DOB 08-31-1945, MRN [id number]  PCP:  Ann Collie, MD  Cardiologist:  Ann More, MD    Referring MD: Ann Collie, MD    ASSESSMENT:    1. Angina pectoris ([place])   2. History of MI (myocardial infarction)   3. Chronic anticoagulation   4. Coronary artery calcification seen on CT scan   5. Precordial pain    PLAN:    In order of problems listed above:  She has a history of heart disease and myocardial infarction without obstructive CAD in the context of hypercoagulable factor V Leiden disorder with chronic anticoagulation to the best of our knowledge she had little or no obstructive CAD at the time although records are unavailable clinical question is whether 20 years later the situation is different I think she needs a quick evaluation be set up for CTA facilitated answer the question if she has worsening or severe obstructive CAD I will increase the dose of her beta-blocker continue her calcium channel blocker give her prescription for nitroglycerin. She has had close to 30 recorded heart rhythm strips and no demonstrable arrhythmia Continue her lipid-lowering treatment   Next appointment: 4 weeks   Medication Adjustments/Labs and Tests Ordered: Current medicines are reviewed at length with the patient today.  Concerns regarding medicines are outlined above.  Orders Placed This Encounter  Procedures   CT CORONARY MORPH W/CTA COR W/SCORE W/CA W/CM &/OR WO/CM   Basic Metabolic Panel (BMET)   EKG 12-Lead   Meds ordered this encounter  Medications   metoprolol tartrate (LOPRESSOR) 25 MG tablet    Sig: Take 1 tablet (25 mg total) by mouth 2 (two) times daily.    Dispense:  180 tablet    Refill:  3   nitroGLYCERIN (NITROSTAT) 0.4 MG SL tablet    Sig: Place 1 tablet (0.4 mg total) under the tongue every 5 (five) minutes as needed for chest pain.    Dispense:  25 tablet    Refill:  1   metoprolol  tartrate (LOPRESSOR) 100 MG tablet    Sig: Take 1 tablet (100 mg total) by mouth once for 1 dose. Please take 2 hours prior to CT    Dispense:  1 tablet    Refill:  0    Chief Complaint  Patient presents with   Follow-up   Palpitations    History of Present Illness:    Ann Lester is a 77 y.o. female with a hx of factor V Leiden mutation with long-term anticoagulation history of previous SVT coronary artery calcification angina with normal coronary arteriography  She also has coronary and aortic atherosclerosis with statin therapy last seen 04/14/2022.  She previously was seen by my partner Dr. Geraldo Pitter around 2005 she had an event they called a heart attack she had a heart catheterization she thinks was normal or near normal.  She had an echocardiogram [facility] 07/11/2021 EF 60 to 65% impaired relaxation mild left and right atrial enlargement trace to mild aortic mitral and tricuspid regurgitation.   She was seen in [facility] ED 02/08/2022 with complaints of palpitation but apparently at some point there was also chest discomfort.  The ED relates she has a history of supraventricular tachycardia as well as factor V mutation with long-term anticoagulation.  Hemoglobin was normal 13 3 creatinine 0.50 potassium 3.7 troponin undetectable TSH was normal EKG was described as sinus rhythm and normal.  Chest CTA was performed described as having severe coronary artery calcification no finding of pulmonary embolism. I independently reviewed the EKG which showed sinus rhythm with first-degree AV block and was otherwise normal. She had a myocardial perfusion study done 07/11/2021 with no evidence of ischemia and a normal ejection fraction.  Compliance with diet, lifestyle and medications: Yes  This is a very difficult complex visit especially as I do not have source cardiology records. In the range of 2005 she sustained ACS or myocardial infarction transferred to [facility]: [place] underwent coronary angiography had no intervention.  There were told that she had mild heart attack and mild CAD Since then she has intermittent episodes of nonexertional chest pain not frequent not severe and not really disruptive. Now she has had a rapid progression in symptoms she is having episodes frequently lasting longer up to 15 minutes of severe precordial pressure fluttering shortness of breath weakness 1 occasion near loss of consciousness She has an Apple Watch and she is captured 29 strips during the episodes and there is no arrhythmia She is not having other symptoms of edema shortness of breath or chest pain No cough or wheezing She continues anticoagulated without interruption Past Medical History:  Diagnosis Date   Controlled diabetes mellitus with peripheral circulatory disorder ([place]) 04/13/2022   Coronary artery disease    History of pulmonary embolism 05/16/2015   Hyperlipidemia    Hypertension    Obesity 10/23/2021   Osteoporosis 04/13/2022   Psoriatic arthritis ([place])    Vitamin D deficiency 10/23/2021    Past Surgical History:  Procedure Laterality Date   APPENDECTOMY  04/06/2011   CERVICAL DISC SURGERY     CESAREAN SECTION     X2   ORIF PATELLA FRACTURE  04/06/2011   SHOULDER ARTHROSCOPY WITH ROTATOR CUFF REPAIR Right 02/02/2021   TOTAL HIP ARTHROPLASTY Left 10/23/2021   Procedure: TOTAL HIP ARTHROPLASTY ANTERIOR APPROACH;  Surgeon: Rod Can, MD;  Location: [facility];  Service: Orthopedics;  Laterality: Left;    Current Medications: Current Meds  Medication Sig   acetaminophen (TYLENOL) 500 MG tablet Take 1,000 mg by mouth at bedtime.   amoxicillin (AMOXIL) 500 MG tablet Take 2,000 mg by mouth as directed. 1-2 Hours prior to dental work   bismuth subsalicylate (PEPTO BISMOL) 262 MG/15ML suspension Take 30 mLs by mouth every 6 (six) hours as needed for indigestion or diarrhea or loose stools.   Calcium Carbonate (CALCIUM 500 PO) Take  1,000 mg by mouth 2 (two) times daily.   cholecalciferol (VITAMIN D3) 25 MCG (1000 UNIT) tablet Take 1,000 Units by mouth daily.   Coenzyme Q10 (CO Q-10 PO) Take 1 capsule by mouth daily. Unsure of dose   COSENTYX 150 MG/ML SOSY Inject 150 mg into the skin every 30 (thirty) days.   folic acid (FOLVITE) 1 MG tablet Take 1 mg by mouth daily.   leflunomide (ARAVA) 20 MG tablet Take 20 mg by mouth daily.   levothyroxine (SYNTHROID) 88 MCG tablet Take 88 mcg by mouth daily.   metoprolol tartrate (LOPRESSOR) 100 MG tablet Take 1 tablet (100 mg total) by mouth once for 1 dose. Please take 2 hours prior to CT   metoprolol tartrate (LOPRESSOR) 25 MG tablet Take 1 tablet (25 mg total) by mouth 2 (two) times daily.   Milk Thistle 1000 MG CAPS Take 1,000 mg by mouth daily.   nitroGLYCERIN (NITROSTAT) 0.4 MG SL tablet Place 1 tablet (0.4 mg total) under the tongue every 5 (five)  minutes as needed for chest pain.   rosuvastatin (CRESTOR) 20 MG tablet Take 20 mg by mouth 2 (two) times a week.   verapamil (CALAN-SR) 240 MG CR tablet Take 240 mg by mouth daily.   XARELTO 20 MG TABS tablet Take 20 mg by mouth every evening.   [DISCONTINUED] metoprolol tartrate (LOPRESSOR) 25 MG tablet Take 25 mg by mouth daily.     Allergies:   Patient has no known allergies.   Social History   Socioeconomic History   Marital status: Married    Spouse name: Not on file   Number of children: Not on file   Years of education: Not on file   Highest education level: Not on file  Occupational History   Not on file  Tobacco Use   Smoking status: Former    Types: Cigarettes    Passive exposure: Past   Smokeless tobacco: Never  Vaping Use   Vaping Use: Never used  Substance and Sexual Activity   Alcohol use: Yes    Alcohol/week: 14.0 standard drinks of alcohol    Types: 14 Standard drinks or equivalent per week   Drug use: Never   Sexual activity: Not on file  Other Topics Concern   Not on file  Social History  Narrative   Not on file   Social Determinants of Health   Financial Resource Strain: Not on file  Food Insecurity: Not on file  Transportation Needs: Not on file  Physical Activity: Not on file  Stress: Not on file  Social Connections: Not on file     Family History: The patient's family history includes Hypertension in her father; Lung cancer in her mother. ROS:   Please see the history of present illness.    All other systems reviewed and are negative.  EKGs/Labs/Other Studies Reviewed:    The following studies were reviewed today:  EKG:  EKG ordered today and personally reviewed.  The ekg ordered today demonstrates sinus rhythm normal  Recent Labs: 10/24/2021: BUN 15; Creatinine, Ser 0.61; Hemoglobin 12.1; Platelets 124; Potassium 4.1; Sodium 137  Recent Lipid Panel No results found for: "CHOL", "TRIG", "HDL", "CHOLHDL", "VLDL", "LDLCALC", "LDLDIRECT"  Physical Exam:    VS:  BP (!) 168/80 (BP Location: Right Arm, Patient Position: Sitting)   Pulse 67   Ht 5' 5.5" (1.664 m)   Wt 147 lb (66.7 kg)   SpO2 97%   BMI 24.09 kg/m     Wt Readings from Last 3 Encounters:  07/15/22 147 lb (66.7 kg)  04/14/22 151 lb 6.4 oz (68.7 kg)  03/30/22 155 lb (70.3 kg)     GEN:  Well nourished, well developed in no acute distress HEENT: Normal NECK: No JVD; No carotid bruits LYMPHATICS: No lymphadenopathy CARDIAC: RRR, no murmurs, rubs, gallops RESPIRATORY:  Clear to auscultation without rales, wheezing or rhonchi  ABDOMEN: Soft, non-tender, non-distended MUSCULOSKELETAL:  No edema; No deformity  SKIN: Warm and dry NEUROLOGIC:  Alert and oriented x 3 PSYCHIATRIC:  Normal affect    Signed, Ann More, MD  07/15/2022 10:09 AM    [place]

## 2022-07-15 ENCOUNTER — Telehealth: Payer: Self-pay | Admitting: Cardiology

## 2022-07-15 ENCOUNTER — Encounter: Payer: Self-pay | Admitting: Cardiology

## 2022-07-15 ENCOUNTER — Ambulatory Visit: Payer: Medicare PPO | Attending: Cardiology | Admitting: Cardiology

## 2022-07-15 VITALS — BP 157/72 | HR 67 | Ht 65.5 in | Wt 147.0 lb

## 2022-07-15 DIAGNOSIS — I209 Angina pectoris, unspecified: Secondary | ICD-10-CM | POA: Diagnosis not present

## 2022-07-15 DIAGNOSIS — I2510 Atherosclerotic heart disease of native coronary artery without angina pectoris: Secondary | ICD-10-CM

## 2022-07-15 DIAGNOSIS — E669 Obesity, unspecified: Secondary | ICD-10-CM | POA: Diagnosis not present

## 2022-07-15 DIAGNOSIS — I252 Old myocardial infarction: Secondary | ICD-10-CM

## 2022-07-15 DIAGNOSIS — Z7901 Long term (current) use of anticoagulants: Secondary | ICD-10-CM | POA: Diagnosis not present

## 2022-07-15 DIAGNOSIS — R072 Precordial pain: Secondary | ICD-10-CM | POA: Diagnosis not present

## 2022-07-15 DIAGNOSIS — E1122 Type 2 diabetes mellitus with diabetic chronic kidney disease: Secondary | ICD-10-CM | POA: Diagnosis not present

## 2022-07-15 DIAGNOSIS — I25119 Atherosclerotic heart disease of native coronary artery with unspecified angina pectoris: Secondary | ICD-10-CM

## 2022-07-15 MED ORDER — METOPROLOL TARTRATE 25 MG PO TABS: 25.0000 mg | ORAL_TABLET | Freq: Two times a day (BID) | ORAL | 3 refills | Status: DC

## 2022-07-15 MED ORDER — METOPROLOL TARTRATE 100 MG PO TABS: 100.0000 mg | ORAL_TABLET | Freq: Once | ORAL | 0 refills | Status: DC

## 2022-07-15 MED ORDER — NITROGLYCERIN 0.4 MG SL SUBL: 0.4000 mg | SUBLINGUAL_TABLET | SUBLINGUAL | 1 refills | Status: DC | PRN

## 2022-07-15 NOTE — Telephone Encounter (Signed)
Pt c/o medication issue:  1. Name of Medication:  metoprolol tartrate (LOPRESSOR) 100 MG tablet  metoprolol tartrate (LOPRESSOR) 25 MG tablet   2. How are you currently taking this medication (dosage and times per day)? Needs to verify dose  3. Are you having a reaction (difficulty breathing--STAT)? no  4. What is your medication issue? Patient states she was told her metoprolol would be doubled to 1 tablet twice a day. She says she has been taking 100 mg daily, but her new prescription says 25 mg twice a day. Please advise.

## 2022-07-15 NOTE — Telephone Encounter (Signed)
Patient stated she is scheduled for a CT test next Wednesday and will need to have lab work done 1 week before.  Patient wants to confirm she can still do the test next week if she does her labs tomorrow (2/2).

## 2022-07-15 NOTE — Patient Instructions (Signed)
Medication Instructions:  Your physician has recommended you make the following change in your medication:   START: Nitroglycerin 0.4 mg under the tongue every 5 minutes as needed for chest pain START: Lopressor 25 mg twice daily  *If you need a refill on your cardiac medications before your next appointment, please call your pharmacy*   Lab Work: Your physician recommends that you return for lab work in:   Labs 1 week before CT: BMP  If you have labs (blood work) drawn today and your tests are completely normal, you will receive your results only by: [place] (if you have MyChart) OR A paper copy in the mail If you have any lab test that is abnormal or we need to change your treatment, we will call you to review the results.   Testing/Procedures:   Your cardiac CT will be scheduled at one of the below locations:   [facility] [address] [place], [place] [address] (336) [place] [address] [place], [place] [address] [phone]  [facility] [place],  [address] [phone]  If scheduled at [facility], please arrive at the [facility] and Children's Entrance (Entrance C2) of [facility] 30 minutes prior to test start time. You can use the FREE valet parking offered at entrance C (encouraged to control the heart rate for the test)  Proceed to the [facility] Radiology Department (first floor) to check-in and test prep.  All radiology patients and guests should use entrance C2 at [facility], accessed from [facility], even though the hospital's physical address listed is [address]    If scheduled at [facility] or [facility], please arrive 15 mins early for check-in and test prep.   Please follow these instructions carefully (unless  otherwise directed):  On the Night Before the Test: Be sure to Drink plenty of water. Do not consume any caffeinated/decaffeinated beverages or chocolate 12 hours prior to your test. Do not take any antihistamines 12 hours prior to your test.  On the Day of the Test: Drink plenty of water until 1 hour prior to the test. Do not eat any food 1 hour prior to test. You may take your regular medications prior to the test.  Take metoprolol (Lopressor) two hours prior to test. FEMALES- please wear underwire-free bra if available, avoid dresses & tight clothing      After the Test: Drink plenty of water. After receiving IV contrast, you may experience a mild flushed feeling. This is normal. On occasion, you may experience a mild rash up to 24 hours after the test. This is not dangerous. If this occurs, you can take Benadryl 25 mg and increase your fluid intake. If you experience trouble breathing, this can be serious. If it is severe call 911 IMMEDIATELY. If it is mild, please call our office. If you take any of these medications: Glipizide/Metformin, Avandament, Glucavance, please do not take 48 hours after completing test unless otherwise instructed.  We will call to schedule your test 2-4 weeks out understanding that some insurance companies will need an authorization prior to the service being performed.   For non-scheduling related questions, please contact the cardiac imaging nurse navigator should you have any questions/concerns: Marchia Bond, Cardiac Imaging Nurse Navigator Gordy Clement, Cardiac Imaging Nurse Navigator [facility] Direct Office  Dial: [phone]   For scheduling needs, including cancellations and rescheduling, please call [place], [phone].    Follow-Up: At [facility], you and your health needs are our priority.  As part of our continuing mission to provide you with exceptional heart care, we have created designated  Provider Care Teams.  These Care Teams include your primary Cardiologist (physician) and Advanced Practice Providers (APPs -  Physician Assistants and Nurse Practitioners) who all work together to provide you with the care you need, when you need it.  We recommend signing up for the patient portal called "MyChart".  Sign up information is provided on this After Visit Summary.  MyChart is used to connect with patients for Virtual Visits (Telemedicine).  Patients are able to view lab/test results, encounter notes, upcoming appointments, etc.  Non-urgent messages can be sent to your provider as well.   To learn more about what you can do with MyChart, go to [email or web address].    Your next appointment:   4 week(s)  Provider:   Shirlee More, MD    Other Instructions None

## 2022-07-16 DIAGNOSIS — Z7901 Long term (current) use of anticoagulants: Secondary | ICD-10-CM | POA: Diagnosis not present

## 2022-07-16 DIAGNOSIS — R072 Precordial pain: Secondary | ICD-10-CM | POA: Diagnosis not present

## 2022-07-16 DIAGNOSIS — I2510 Atherosclerotic heart disease of native coronary artery without angina pectoris: Secondary | ICD-10-CM | POA: Diagnosis not present

## 2022-07-16 LAB — BASIC METABOLIC PANEL
BUN/Creatinine Ratio: 31 — ABNORMAL HIGH (ref 12–28)
BUN: 18 mg/dL (ref 8–27)
CO2: 23 mmol/L (ref 20–29)
Calcium: 9 mg/dL (ref 8.7–10.3)
Chloride: 106 mmol/L (ref 96–106)
Creatinine, Ser: 0.59 mg/dL (ref 0.57–1.00)
Glucose: 86 mg/dL (ref 70–99)
Potassium: 4.1 mmol/L (ref 3.5–5.2)
Sodium: 142 mmol/L (ref 134–144)
eGFR: 94 mL/min/{1.73_m2} (ref >59)

## 2022-07-16 NOTE — Telephone Encounter (Signed)
Patient notified ok to have labs done toda y.

## 2022-07-20 ENCOUNTER — Telehealth (HOSPITAL_COMMUNITY): Payer: Self-pay | Admitting: *Deleted

## 2022-07-20 DIAGNOSIS — M25512 Pain in left shoulder: Secondary | ICD-10-CM | POA: Diagnosis not present

## 2022-07-20 DIAGNOSIS — M7542 Impingement syndrome of left shoulder: Secondary | ICD-10-CM | POA: Diagnosis not present

## 2022-07-20 NOTE — Telephone Encounter (Signed)
Reaching out to patient to offer assistance regarding upcoming cardiac imaging study; pt verbalizes understanding of appt date/time, parking situation and where to check in, pre-test NPO status and medications ordered, and verified current allergies; name and call back number provided for further questions should they arise  Abshir Paolini RN Navigator Cardiac Imaging [facility] [phone] office [phone] cell  Patient to take 50mg metoprolol tartrate two hours prior to her cardiac CT scan.  

## 2022-07-20 NOTE — Telephone Encounter (Signed)
Attempted to call patient regarding upcoming cardiac CT appointment. °Left message on voicemail with name and callback number ° °Dondrea Clendenin RN Navigator Cardiac Imaging °[facility] °[phone] Office °[phone] Cell ° °

## 2022-07-21 ENCOUNTER — Ambulatory Visit (HOSPITAL_COMMUNITY)
Admission: RE | Admit: 2022-07-21 | Discharge: 2022-07-21 | Disposition: A | Discharge status: Home / Self Care | Payer: Medicare PPO | Source: Ambulatory Visit | Attending: Cardiology | Admitting: Cardiology

## 2022-07-21 DIAGNOSIS — R072 Precordial pain: Secondary | ICD-10-CM | POA: Diagnosis not present

## 2022-07-21 MED ORDER — NITROGLYCERIN 0.4 MG SL SUBL
0.8000 mg | SUBLINGUAL_TABLET | Freq: Once | SUBLINGUAL | Status: AC
  Administered 2022-07-21: 0.8 mg via SUBLINGUAL

## 2022-07-21 MED ORDER — NITROGLYCERIN 0.4 MG SL SUBL
SUBLINGUAL_TABLET | SUBLINGUAL | Status: AC
  Filled 2022-07-21: qty 2

## 2022-07-21 MED ORDER — IOHEXOL 350 MG/ML SOLN
100.0000 mL | Freq: Once | INTRAVENOUS | Status: AC | PRN
  Administered 2022-07-21: 100 mL via INTRAVENOUS

## 2022-07-22 DIAGNOSIS — M25512 Pain in left shoulder: Secondary | ICD-10-CM | POA: Diagnosis not present

## 2022-07-22 DIAGNOSIS — M7542 Impingement syndrome of left shoulder: Secondary | ICD-10-CM | POA: Diagnosis not present

## 2022-07-26 DIAGNOSIS — M810 Age-related osteoporosis without current pathological fracture: Secondary | ICD-10-CM | POA: Diagnosis not present

## 2022-07-28 DIAGNOSIS — M7542 Impingement syndrome of left shoulder: Secondary | ICD-10-CM | POA: Diagnosis not present

## 2022-07-28 DIAGNOSIS — M25512 Pain in left shoulder: Secondary | ICD-10-CM | POA: Diagnosis not present

## 2022-07-30 DIAGNOSIS — M7542 Impingement syndrome of left shoulder: Secondary | ICD-10-CM | POA: Diagnosis not present

## 2022-07-30 DIAGNOSIS — M25512 Pain in left shoulder: Secondary | ICD-10-CM | POA: Diagnosis not present

## 2022-08-02 ENCOUNTER — Other Ambulatory Visit: Payer: Self-pay

## 2022-08-02 DIAGNOSIS — S72032D Displaced midcervical fracture of left femur, subsequent encounter for closed fracture with routine healing: Secondary | ICD-10-CM | POA: Diagnosis not present

## 2022-08-02 NOTE — Telephone Encounter (Signed)
Patient had her cardiac CT and is currently taking her Metoprolol tartrate as prescribed. Patient had no further questions at this time.

## 2022-08-08 NOTE — Progress Notes (Deleted)
Cardiology Office Note:    Date:  08/08/2022   ID:  Ann Lester, DOB February 02, 1946, MRN [id number]  PCP:  Marco Collie, MD  Cardiologist:  Shirlee More, MD    Referring MD: Marco Collie, MD    ASSESSMENT:    1. Coronary artery disease of native artery of native heart with stable angina pectoris ([place])   2. Agatston coronary artery calcium score greater than 400   3. Chronic anticoagulation   4. Hyperlipidemia, unspecified hyperlipidemia type   5. Primary hypertension   6. Palpitations    PLAN:    In order of problems listed above:  ***   Next appointment: ***   Medication Adjustments/Labs and Tests Ordered: Current medicines are reviewed at length with the patient today.  Concerns regarding medicines are outlined above.  No orders of the defined types were placed in this encounter.  No orders of the defined types were placed in this encounter.   No chief complaint on file.   History of Present Illness:    Ann Lester is a 77 y.o. female with a hx of hypercoagulable disorder with factor V Leiden mutation with long-term anticoagulation history of previous SVT angina with normal coronary arteriography and coronary artery calcification on CT scan.  She was last seen 07/15/2022 with complaints of chest pain and palpitation.  Her smart watch showed 29 episodes of palpitation unassociated with arrhythmia.  Compliance with diet, lifestyle and medications: ***  She had a cardiac CTA reported 07/21/2022. Calcium score was severely elevated 471 85th percentile Mild LAD and right coronary stenosis 25 to 49% was noted. Past Medical History:  Diagnosis Date   Controlled diabetes mellitus with peripheral circulatory disorder ([place]) 04/13/2022   Coronary artery disease    History of pulmonary embolism 05/16/2015   Hyperlipidemia    Hypertension    Obesity 10/23/2021   Osteoporosis 04/13/2022   Psoriatic arthritis ([place])    Vitamin D deficiency 10/23/2021    Past  Surgical History:  Procedure Laterality Date   APPENDECTOMY  04/06/2011   CERVICAL DISC SURGERY     CESAREAN SECTION     X2   ORIF PATELLA FRACTURE  04/06/2011   SHOULDER ARTHROSCOPY WITH ROTATOR CUFF REPAIR Right 02/02/2021   TOTAL HIP ARTHROPLASTY Left 10/23/2021   Procedure: TOTAL HIP ARTHROPLASTY ANTERIOR APPROACH;  Surgeon: Rod Can, MD;  Location: [facility];  Service: Orthopedics;  Laterality: Left;    Current Medications: No outpatient medications have been marked as taking for the 08/10/22 encounter (Appointment) with Richardo Priest, MD.     Allergies:   Patient has no known allergies.   Social History   Socioeconomic History   Marital status: Married    Spouse name: Not on file   Number of children: Not on file   Years of education: Not on file   Highest education level: Not on file  Occupational History   Not on file  Tobacco Use   Smoking status: Former    Types: Cigarettes    Passive exposure: Past   Smokeless tobacco: Never  Vaping Use   Vaping Use: Never used  Substance and Sexual Activity   Alcohol use: Yes    Alcohol/week: 14.0 standard drinks of alcohol    Types: 14 Standard drinks or equivalent per week   Drug use: Never   Sexual activity: Not on file  Other Topics Concern   Not on file  Social History Narrative   Not on file   Social Determinants of Health  Financial Resource Strain: Not on file  Food Insecurity: Not on file  Transportation Needs: Not on file  Physical Activity: Not on file  Stress: Not on file  Social Connections: Not on file     Family History: The patient's ***family history includes Hypertension in her father; Lung cancer in her mother. ROS:   Please see the history of present illness.    All other systems reviewed and are negative.  EKGs/Labs/Other Studies Reviewed:    The following studies were reviewed today:  EKG:  EKG ordered today and personally reviewed.  The ekg ordered today demonstrates  ***  Recent Labs: 10/24/2021: Hemoglobin 12.1; Platelets 124 07/16/2022: BUN 18; Creatinine, Ser 0.59; Potassium 4.1; Sodium 142  Recent Lipid Panel No results found for: "CHOL", "TRIG", "HDL", "CHOLHDL", "VLDL", "LDLCALC", "LDLDIRECT"  Physical Exam:    VS:  There were no vitals taken for this visit.    Wt Readings from Last 3 Encounters:  07/15/22 147 lb (66.7 kg)  04/14/22 151 lb 6.4 oz (68.7 kg)  03/30/22 155 lb (70.3 kg)     GEN: *** Well nourished, well developed in no acute distress HEENT: Normal NECK: No JVD; No carotid bruits LYMPHATICS: No lymphadenopathy CARDIAC: ***RRR, no murmurs, rubs, gallops RESPIRATORY:  Clear to auscultation without rales, wheezing or rhonchi  ABDOMEN: Soft, non-tender, non-distended MUSCULOSKELETAL:  No edema; No deformity  SKIN: Warm and dry NEUROLOGIC:  Alert and oriented x 3 PSYCHIATRIC:  Normal affect    Signed, Shirlee More, MD  08/08/2022 11:22 AM    [place]

## 2022-08-10 ENCOUNTER — Ambulatory Visit: Payer: Medicare PPO | Admitting: Cardiology

## 2022-08-10 DIAGNOSIS — R931 Abnormal findings on diagnostic imaging of heart and coronary circulation: Secondary | ICD-10-CM

## 2022-08-10 DIAGNOSIS — I10 Essential (primary) hypertension: Secondary | ICD-10-CM

## 2022-08-10 DIAGNOSIS — I25118 Atherosclerotic heart disease of native coronary artery with other forms of angina pectoris: Secondary | ICD-10-CM

## 2022-08-10 DIAGNOSIS — Z7901 Long term (current) use of anticoagulants: Secondary | ICD-10-CM

## 2022-08-10 DIAGNOSIS — R002 Palpitations: Secondary | ICD-10-CM

## 2022-08-10 DIAGNOSIS — E785 Hyperlipidemia, unspecified: Secondary | ICD-10-CM

## 2022-08-13 DIAGNOSIS — E1122 Type 2 diabetes mellitus with diabetic chronic kidney disease: Secondary | ICD-10-CM | POA: Diagnosis not present

## 2022-08-13 DIAGNOSIS — E669 Obesity, unspecified: Secondary | ICD-10-CM | POA: Diagnosis not present

## 2022-08-15 ENCOUNTER — Emergency Department (HOSPITAL_COMMUNITY): Payer: Medicare PPO

## 2022-08-15 ENCOUNTER — Emergency Department (HOSPITAL_COMMUNITY)
Admission: EM | Admit: 2022-08-15 | Discharge: 2022-08-15 | Disposition: A | Discharge status: Home / Self Care | Payer: Medicare PPO | Attending: Emergency Medicine | Admitting: Emergency Medicine

## 2022-08-15 DIAGNOSIS — Z7901 Long term (current) use of anticoagulants: Secondary | ICD-10-CM | POA: Insufficient documentation

## 2022-08-15 DIAGNOSIS — R0789 Other chest pain: Secondary | ICD-10-CM | POA: Diagnosis not present

## 2022-08-15 DIAGNOSIS — R072 Precordial pain: Secondary | ICD-10-CM | POA: Diagnosis not present

## 2022-08-15 DIAGNOSIS — R0602 Shortness of breath: Secondary | ICD-10-CM | POA: Diagnosis not present

## 2022-08-15 DIAGNOSIS — I959 Hypotension, unspecified: Secondary | ICD-10-CM | POA: Diagnosis not present

## 2022-08-15 DIAGNOSIS — R079 Chest pain, unspecified: Secondary | ICD-10-CM | POA: Diagnosis not present

## 2022-08-15 DIAGNOSIS — R42 Dizziness and giddiness: Secondary | ICD-10-CM | POA: Insufficient documentation

## 2022-08-15 LAB — CBC WITH DIFFERENTIAL/PLATELET
Abs Immature Granulocytes: 0.02 10*3/uL (ref 0.00–0.07)
Basophils Absolute: 0.1 10*3/uL (ref 0.0–0.1)
Basophils Relative: 1 %
Eosinophils Absolute: 0.3 10*3/uL (ref 0.0–0.5)
Eosinophils Relative: 6 %
HCT: 38.2 % (ref 36.0–46.0)
Hemoglobin: 12.9 g/dL (ref 12.0–15.0)
Immature Granulocytes: 0 %
Lymphocytes Relative: 23 %
Lymphs Abs: 1.3 10*3/uL (ref 0.7–4.0)
MCH: 31.4 pg (ref 26.0–34.0)
MCHC: 33.8 g/dL (ref 30.0–36.0)
MCV: 92.9 fL (ref 80.0–100.0)
Monocytes Absolute: 0.7 10*3/uL (ref 0.1–1.0)
Monocytes Relative: 12 %
Neutro Abs: 3.3 10*3/uL (ref 1.7–7.7)
Neutrophils Relative %: 58 %
Platelets: 163 10*3/uL (ref 150–400)
RBC: 4.11 MIL/uL (ref 3.87–5.11)
RDW: 14.1 % (ref 11.5–15.5)
WBC: 5.6 10*3/uL (ref 4.0–10.5)
nRBC: 0 % (ref 0.0–0.2)

## 2022-08-15 LAB — BASIC METABOLIC PANEL
Anion gap: 12 (ref 5–15)
BUN: 23 mg/dL (ref 8–23)
CO2: 24 mmol/L (ref 22–32)
Calcium: 9.5 mg/dL (ref 8.9–10.3)
Chloride: 104 mmol/L (ref 98–111)
Creatinine, Ser: 0.72 mg/dL (ref 0.44–1.00)
GFR, Estimated: >60 mL/min (ref >60)
Glucose, Bld: 98 mg/dL (ref 70–99)
Potassium: 3.8 mmol/L (ref 3.5–5.1)
Sodium: 140 mmol/L (ref 135–145)

## 2022-08-15 LAB — TROPONIN I (HIGH SENSITIVITY)
Troponin I (High Sensitivity): 12 ng/L (ref <18)
Troponin I (High Sensitivity): 12 ng/L (ref <18)

## 2022-08-15 LAB — MAGNESIUM: Magnesium: 1.9 mg/dL (ref 1.7–2.4)

## 2022-08-15 LAB — BRAIN NATRIURETIC PEPTIDE: B Natriuretic Peptide: 106.1 pg/mL — ABNORMAL HIGH (ref 0.0–100.0)

## 2022-08-15 MED ORDER — ISOSORBIDE MONONITRATE ER 30 MG PO TB24: 15.0000 mg | ORAL_TABLET | Freq: Every day | ORAL | 0 refills | Status: DC

## 2022-08-15 MED ORDER — ISOSORBIDE MONONITRATE ER 30 MG PO TB24
15.0000 mg | ORAL_TABLET | Freq: Once | ORAL | Status: AC
  Administered 2022-08-15: 15 mg via ORAL
  Filled 2022-08-15: qty 1

## 2022-08-15 NOTE — ED Triage Notes (Signed)
Patient arrived to the ED via EMS from church for chest pain and SOB. Patient states this happened suddenly and started sweating. Patient has history of MI is 2007. Patient take metoprolol. Patient has complaints of left sided chest pressure at this time. Patient received 1 nitro en route. Patient states the pain/pressure is intermittent. Patient pain 0/10 at this time. Patient A&Ox4.

## 2022-08-15 NOTE — ED Notes (Signed)
Patient ambulated to bathroom.

## 2022-08-15 NOTE — ED Provider Notes (Signed)
[facility] Provider Note   CSN: [id number] Arrival date & time: 08/15/22  1130     History  Chief Complaint  Patient presents with   Chest Pain    Ann Lester is a 77 y.o. female.  77 year old female presents today for evaluation of chest pain, shortness of breath, lightheadedness, diaphoresis that occurred earlier today.  Patient was standing in the choir when this episode came on.  She states she has had similar episodes about once a week for the past year.  She recently underwent coronary CT with stable findings.  She states she had an MI in 2007 at which time she underwent LHC.  No stent was placed within.  During these episodes she describes chest pressure that is substernal.  Does not radiate anywhere.  She states in 2007 during her MI she did not radiate to her left arm.  She states all of her episodes that occur weekly for the past year or associated with activity.  Currently she is asymptomatic.  She did receive nitroglycerin and route which relieved her chest pressure.  The history is provided by the patient. No language interpreter was used.       Home Medications Prior to Admission medications   Medication Sig Start Date End Date Taking? Authorizing Provider  acetaminophen (TYLENOL) 500 MG tablet Take 1,000 mg by mouth at bedtime.    [provider]  amoxicillin (AMOXIL) 500 MG tablet Take 2,000 mg by mouth as directed. 1-2 Hours prior to dental work 12/28/21   [provider]  bismuth subsalicylate (PEPTO BISMOL) 262 MG/15ML suspension Take 30 mLs by mouth every 6 (six) hours as needed for indigestion or diarrhea or loose stools.    [provider]  Calcium Carbonate (CALCIUM 500 PO) Take 1,000 mg by mouth 2 (two) times daily.    [provider]  cholecalciferol (VITAMIN D3) 25 MCG (1000 UNIT) tablet Take 1,000 Units by mouth daily.    [provider]  Coenzyme Q10 (CO Q-10 PO)  Take 1 capsule by mouth daily. Unsure of dose    [provider]  COSENTYX 150 MG/ML SOSY Inject 150 mg into the skin every 30 (thirty) days. 10/21/21   [provider]  folic acid (FOLVITE) 1 MG tablet Take 1 mg by mouth daily. 09/24/21   [provider]  leflunomide (ARAVA) 20 MG tablet Take 20 mg by mouth daily. 09/16/21   [provider]  levothyroxine (SYNTHROID) 88 MCG tablet Take 88 mcg by mouth daily. 10/08/21   [provider]  metoprolol tartrate (LOPRESSOR) 100 MG tablet Take 1 tablet (100 mg total) by mouth once for 1 dose. Please take 2 hours prior to CT 07/15/22 07/15/22  Richardo Priest, MD  metoprolol tartrate (LOPRESSOR) 25 MG tablet Take 1 tablet (25 mg total) by mouth 2 (two) times daily. 07/15/22   Richardo Priest, MD  Milk Thistle 1000 MG CAPS Take 1,000 mg by mouth daily.    [provider]  nitroGLYCERIN (NITROSTAT) 0.4 MG SL tablet Place 1 tablet (0.4 mg total) under the tongue every 5 (five) minutes as needed for chest pain. 07/15/22   Richardo Priest, MD  rosuvastatin (CRESTOR) 20 MG tablet Take 20 mg by mouth 2 (two) times a week.    [provider]  verapamil (CALAN-SR) 240 MG CR tablet Take 240 mg by mouth daily. 10/12/21   [provider]  XARELTO 20 MG TABS tablet  Take 20 mg by mouth every evening. 08/20/21   [provider]      Allergies    Patient has no known allergies.    Review of Systems   Review of Systems  Constitutional:  Negative for chills and fever.  Eyes:  Negative for visual disturbance.  Respiratory:  Negative for shortness of breath.   Cardiovascular:  Positive for chest pain. Negative for palpitations and leg swelling.  Gastrointestinal:  Negative for abdominal pain.  Neurological:  Positive for light-headedness. Negative for syncope.  All other systems reviewed and are negative.   Physical Exam Updated Vital Signs BP 129/73 (BP Location: Left Arm)   Pulse 62   Temp 97.7 F  (36.5 C) (Oral)   Resp 20   Ht 5' 5.5" (1.664 m)   Wt 66.7 kg   SpO2 100%   BMI 24.09 kg/m  Physical Exam Vitals and nursing note reviewed.  Constitutional:      General: She is not in acute distress.    Appearance: Normal appearance. She is not ill-appearing.  HENT:     Head: Normocephalic and atraumatic.     Nose: Nose normal.  Eyes:     General: No scleral icterus.    Extraocular Movements: Extraocular movements intact.     Conjunctiva/sclera: Conjunctivae normal.  Cardiovascular:     Rate and Rhythm: Normal rate and regular rhythm.     Pulses: Normal pulses.  Pulmonary:     Effort: Pulmonary effort is normal. No respiratory distress.     Breath sounds: Normal breath sounds. No wheezing or rales.  Abdominal:     General: There is no distension.     Tenderness: There is no abdominal tenderness.  Musculoskeletal:        General: Normal range of motion.     Cervical back: Normal range of motion.  Skin:    General: Skin is warm and dry.  Neurological:     General: No focal deficit present.     Mental Status: She is alert. Mental status is at baseline.     ED Results / Procedures / Treatments   Labs (all labs ordered are listed, but only abnormal results are displayed) Labs Reviewed  BRAIN NATRIURETIC PEPTIDE - Abnormal; Notable for the following components:      Result Value   B Natriuretic Peptide 106.1 (*)    All other components within normal limits  BASIC METABOLIC PANEL  CBC WITH DIFFERENTIAL/PLATELET  MAGNESIUM  TROPONIN I (HIGH SENSITIVITY)  TROPONIN I (HIGH SENSITIVITY)    EKG EKG Interpretation  Date/Time:  Sunday August 15 2022 11:38:09 EST Ventricular Rate:  62 PR Interval:  219 QRS Duration: 89 QT Interval:  446 QTC Calculation: 453 R Axis:   56 Text Interpretation: Sinus rhythm Nonspecific T wave abnormality No significant change since last tracing Confirmed by Lajean Saver [phone]) on 08/15/2022 11:44:09 AM  Radiology DG Chest Portable 1  View  Result Date: 08/15/2022 CLINICAL DATA:  Chest pain scalp EXAM: PORTABLE CHEST 1 VIEW COMPARISON:  10/22/2021 FINDINGS: The heart size and mediastinal contours are within normal limits. Both lungs are clear. The visualized skeletal structures are unremarkable. IMPRESSION: No active disease. Electronically Signed   By: Kerby Moors M.D.   On: 08/15/2022 12:27    Procedures Procedures    Medications Ordered in ED Medications - No data to display  ED Course/ Medical Decision Making/ A&P  Medical Decision Making Amount and/or Complexity of Data Reviewed Labs: ordered. Radiology: ordered.   Medical Decision Making / ED Course   This patient presents to the ED for concern of chest pain, this involves an extensive number of treatment options, and is a complaint that carries with it a high risk of complications and morbidity.  The differential diagnosis includes ACS, PE, pneumonia, MSK etiology, GERD  MDM: 77 year old female with past medical history NSTEMI in 2007 without obstructive CAD, history of PE on Eliquis who presents today for evaluation of chest pain that occurred while patient was at church.  She states she has similar episodes weekly for the past year.  Always with exertion.  Currently asymptomatic following dose of nitroglycerin and route.  Vital signs are stable.  She is without acute distress.  Recently had a coronary CT scan done which showed nonobstructive CAD.  CBC unremarkable, BMP unremarkable, BNP of 106, magnesium 1.9, initial troponin of 12.  Chest x-ray without acute cardiopulmonary process.  With mildly elevated BNP no signs of volume overload on exam.  EKG without acute ischemic changes.  Discussed with cardiology given history that sounds typical for angina, and moderate heart score.  Dr. Margaretann Loveless recommends repeating troponin if negative patient can be offered choice between obs versus discharge with close cardiology follow-up.  They  recommend starting patient on Imdur given she had improvement with sublingual nitro.  They said if the troponin is elevated then she will need to be admitted for ACS.  Low suspicion for ACS at this time.  Shared decision making had with patient.  She states if the second troponin is negative she would like to discharge with close cardiology follow-up.  She states she will call cardiology first thing in the morning.  Will give first dose of Imdur in the emergency department.  She is agreeable to start this.     Additional history obtained: -Additional history obtained from recent cardiology clinic visit.  Recent coronary CT which demonstrated nonobstructive CAD -External records from outside source obtained and reviewed including: Chart review including previous notes, labs, imaging, consultation notes   Lab Tests: -I ordered, reviewed, and interpreted labs.   The pertinent results include:   Labs Reviewed  BRAIN NATRIURETIC PEPTIDE - Abnormal; Notable for the following components:      Result Value   B Natriuretic Peptide 106.1 (*)    All other components within normal limits  BASIC METABOLIC PANEL  CBC WITH DIFFERENTIAL/PLATELET  MAGNESIUM  TROPONIN I (HIGH SENSITIVITY)  TROPONIN I (HIGH SENSITIVITY)      EKG  EKG Interpretation  Date/Time:  Sunday August 15 2022 11:38:09 EST Ventricular Rate:  62 PR Interval:  219 QRS Duration: 89 QT Interval:  446 QTC Calculation: 453 R Axis:   56 Text Interpretation: Sinus rhythm Nonspecific T wave abnormality No significant change since last tracing Confirmed by Lajean Saver [phone]) on 08/15/2022 11:44:09 AM        Imaging Studies ordered: I ordered imaging studies including cxr I independently visualized and interpreted imaging. I agree with the radiologist interpretation   Medicines ordered and prescription drug management: No orders of the defined types were placed in this encounter.   -I have reviewed the patients home  medicines and have made adjustments as needed  Consultations Obtained: I requested consultation with the cardiology,  and discussed lab and imaging findings as well as pertinent plan - they recommend: As above   Cardiac Monitoring: The patient was maintained on a cardiac monitor.  I personally viewed and interpreted the cardiac monitored which showed an underlying rhythm of: Normal sinus rhythm  Reevaluation: After the interventions noted above, I reevaluated the patient and found that they have :resolved Remained without chest pain throughout the ED stay  Co morbidities that complicate the patient evaluation  Past Medical History:  Diagnosis Date   Controlled diabetes mellitus with peripheral circulatory disorder ([place]) 04/13/2022   Coronary artery disease    History of pulmonary embolism 05/16/2015   Hyperlipidemia    Hypertension    Obesity 10/23/2021   Osteoporosis 04/13/2022   Psoriatic arthritis ([place])    Vitamin D deficiency 10/23/2021      Dispostion: Patient at the end of my shift awaiting repeat troponin.  If this is negative patient would like to be discharged with close cardiology follow-up.  Imdur sent to patient's choice of pharmacy.  Patient signed out to oncoming provider Ovid Curd PA-C) to follow-up on repeat troponin.   Final Clinical Impression(s) / ED Diagnoses Final diagnoses:  Precordial chest pain    Rx / DC Orders ED Discharge Orders          Ordered    isosorbide mononitrate (IMDUR) 30 MG 24 hr tablet  Daily        08/15/22 1531              Evlyn Courier, PA-C 08/15/22 1534    Lajean Saver, MD 08/19/22 [phone]

## 2022-08-15 NOTE — ED Provider Notes (Signed)
  Physical Exam  BP 121/61   Pulse (!) 59   Temp 97.9 F (36.6 C) (Oral)   Resp 14   Ht 5' 5.5" (1.664 m)   Wt 66.7 kg   SpO2 100%   BMI 24.09 kg/m   Physical Exam  Procedures  Procedures  ED Course / MDM    Medical Decision Making Amount and/or Complexity of Data Reviewed Labs: ordered. Radiology: ordered.  Risk Prescription drug management.   Second troponin is stable. Will discharge home with cardiology follow up.        Sherrell Puller, PA-C 08/15/22 1543    Lajean Saver, MD 08/19/22 [phone]

## 2022-08-15 NOTE — Discharge Instructions (Signed)
Your workup today was reassuring.  No evidence of heart attack.  I discussed your case with cardiology who recommended that you can need to closely follow-up with cardiology, or stay overnight for observation.  Following a discussion we had you felt you would rather follow-up closely with cardiology.  They recommend starting you on a medication called Imdur.  You received your first dose in the emergency department.  Additional refill sent to your pharmacy.  For any concerning symptoms return to the emergency department otherwise please call your cardiologist first thing in the morning.

## 2022-08-17 NOTE — Progress Notes (Unsigned)
Cardiology Office Note:    Date:  08/18/2022   ID:  Barkley Bruns, DOB 10/22/1945, MRN [id number]  PCP:  Richardo Priest, MD  Cardiologist:  Shirlee More, MD    Referring MD: Richardo Priest, MD    ASSESSMENT:    1. Agatston coronary artery calcium score greater than 400   2. Mild CAD   3. Chronic anticoagulation   4. Primary hypertension   5. Hyperlipidemia, unspecified hyperlipidemia type    PLAN:    In order of problems listed above:  Continue current high intensity statin Continues to be symptomatic microvascular coronary disease continue beta-blocker discontinue Imdur discontinue verapamil initiate amlodipine and ranolazine uptitrating in 2 weeks Continue anticoagulant Well-controlled hypertension   Next appointment: 4 weeks   Medication Adjustments/Labs and Tests Ordered: Current medicines are reviewed at length with the patient today.  Concerns regarding medicines are outlined above.  No orders of the defined types were placed in this encounter.  No orders of the defined types were placed in this encounter.   Chief Complaint  Patient presents with   Follow-up    History of Present Illness:    Ann Lester is a 77 y.o. female with a hx of factor V Leiden mutation with long-term anticoagulation previous SVT coronary artery calcification and angina with previous normal coronary arteriography last seen 07/15/2022 with chest pain.  She had a cardiac CT head performed 07/21/2022 showing a coronary calcium score 471 85th percentile and normal thoracic aorta and mild nonobstructive CAD most severe mid right coronary artery 25 to 49%.  She was seen 08/15/2022 in the emergency room [facility] with chest pain.  Her high-sensitivity troponin was normal EKG showed nonspecific T wave unchanged from previous tracing.  She had a repeat high-sensitivity troponin without delta change and was discharged from the hospital.  High-sensitivity troponin was 12 repeat  unchanged chest x-ray showed no active disease CBC normal hemoglobin 12.9 BNP level was low at 106 renal function was normal.  I reviewed the EKG personally from the emergency room showing sinus rhythm 62 bpm first-degree AV block nonspecific T waves in the lateral leads.  Compliance with diet, lifestyle and medications: Yes  She is frustrated she continues to have typical angina without physical effort relieved with rest and nitroglycerin.  After the ED visit she broke a 30 mm Imdur tablet in half took it and had his very severe headache. We reviewed microvascular coronary disease treatment options going to switch to a more effective vasodilator calcium channel blocker amlodipine and initiate ranolazine which can be remarkably effective. Past Medical History:  Diagnosis Date   Controlled diabetes mellitus with peripheral circulatory disorder ([place]) 04/13/2022   Coronary artery disease    History of pulmonary embolism 05/16/2015   Hyperlipidemia    Hypertension    Obesity 10/23/2021   Osteoporosis 04/13/2022   Psoriatic arthritis ([place])    Vitamin D deficiency 10/23/2021    Past Surgical History:  Procedure Laterality Date   APPENDECTOMY  04/06/2011   CERVICAL DISC SURGERY     CESAREAN SECTION     X2   ORIF PATELLA FRACTURE  04/06/2011   SHOULDER ARTHROSCOPY WITH ROTATOR CUFF REPAIR Right 02/02/2021   TOTAL HIP ARTHROPLASTY Left 10/23/2021   Procedure: TOTAL HIP ARTHROPLASTY ANTERIOR APPROACH;  Surgeon: Rod Can, MD;  Location: [facility];  Service: Orthopedics;  Laterality: Left;    Current Medications: Current Meds  Medication Sig   acetaminophen (TYLENOL) 500 MG tablet Take 1,000 mg  by mouth at bedtime.   amoxicillin (AMOXIL) 500 MG tablet Take 2,000 mg by mouth as directed. 1-2 Hours prior to dental work   bismuth subsalicylate (PEPTO BISMOL) 262 MG/15ML suspension Take 30 mLs by mouth every 6 (six) hours as needed for indigestion or diarrhea or loose stools.   Calcium  Carbonate (CALCIUM 500 PO) Take 1,000 mg by mouth 2 (two) times daily.   cholecalciferol (VITAMIN D3) 25 MCG (1000 UNIT) tablet Take 1,000 Units by mouth daily.   Coenzyme Q10 (CO Q-10 PO) Take 1 capsule by mouth daily. Unsure of dose   COSENTYX 150 MG/ML SOSY Inject 150 mg into the skin every 30 (thirty) days.   folic acid (FOLVITE) 1 MG tablet Take 1 mg by mouth daily.   isosorbide mononitrate (IMDUR) 30 MG 24 hr tablet Take 0.5 tablets (15 mg total) by mouth daily.   leflunomide (ARAVA) 20 MG tablet Take 20 mg by mouth daily.   levothyroxine (SYNTHROID) 88 MCG tablet Take 88 mcg by mouth daily.   metoprolol tartrate (LOPRESSOR) 25 MG tablet Take 1 tablet (25 mg total) by mouth 2 (two) times daily.   Milk Thistle 1000 MG CAPS Take 1,000 mg by mouth daily.   nitroGLYCERIN (NITROSTAT) 0.4 MG SL tablet Place 1 tablet (0.4 mg total) under the tongue every 5 (five) minutes as needed for chest pain.   rosuvastatin (CRESTOR) 20 MG tablet Take 20 mg by mouth 2 (two) times a week.   verapamil (CALAN-SR) 240 MG CR tablet Take 240 mg by mouth daily.   XARELTO 20 MG TABS tablet Take 20 mg by mouth every evening.     Allergies:   Patient has no known allergies.   Social History   Socioeconomic History   Marital status: Married    Spouse name: Not on file   Number of children: Not on file   Years of education: Not on file   Highest education level: Not on file  Occupational History   Not on file  Tobacco Use   Smoking status: Former    Types: Cigarettes    Passive exposure: Past   Smokeless tobacco: Never  Vaping Use   Vaping Use: Never used  Substance and Sexual Activity   Alcohol use: Yes    Alcohol/week: 14.0 standard drinks of alcohol    Types: 14 Standard drinks or equivalent per week   Drug use: Never   Sexual activity: Not on file  Other Topics Concern   Not on file  Social History Narrative   Not on file   Social Determinants of Health   Financial Resource Strain: Not on  file  Food Insecurity: Not on file  Transportation Needs: Not on file  Physical Activity: Not on file  Stress: Not on file  Social Connections: Not on file     Family History: The patient's family history includes Hypertension in her father; Lung cancer in her mother. ROS:   Please see the history of present illness.    All other systems reviewed and are negative.  EKGs/Labs/Other Studies Reviewed:    The following studies were reviewed today:  Cardiac Studies & Procedures     STRESS TESTS  MYOCARDIAL PERFUSION IMAGING 05/19/2022     MONITORS  LONG TERM MONITOR (3-14 DAYS) 05/14/2022   CT SCANS  CT CORONARY MORPH W/CTA COR W/SCORE 07/22/2022  Addendum 07/22/2022  9:10 PM ADDENDUM REPORT: 07/22/2022 21:07  EXAM: OVER-READ INTERPRETATION  CT CHEST  The following report is an over-read performed by  radiologist Dr. Collene Leyden [facility] on 07/22/2022. This over-read does not include interpretation of cardiac or coronary anatomy or pathology. The coronary CTA interpretation by the cardiologist is attached.  COMPARISON:  02/08/2022  FINDINGS: Heart is normal size. Aorta normal caliber. No adenopathy. No confluent airspace opacities or effusions. No acute findings in the upper abdomen. Cyst within the left hepatic lobe is stable since prior study. Chest wall soft tissues are unremarkable. No acute bony abnormality.  IMPRESSION: No acute or significant extracardiac abnormality.   Electronically Signed By: Rolm Baptise M.D. On: 07/22/2022 21:07  Narrative CLINICAL DATA:  Chest pain  EXAM: Cardiac CTA  MEDICATIONS: Sub lingual nitro. '4mg'$  and lopressor '100mg'$   TECHNIQUE: The patient was scanned on a Siemens Force [id number] slice scanner. Gantry rotation speed was 250 msecs. Collimation was .6 mm. A 120 kV prospective scan was triggered in the ascending thoracic aorta at 140 HU's Full mA was used between 35% and 75% of the R-R interval. Average HR  during the scan was 58 bpm. The 3D data set was interpreted on a dedicated work station using MPR, MIP and VRT modes. A total of 80cc of contrast was used.  FINDINGS: Non-cardiac: See separate report from [facility]. No significant findings on limited lung and soft tissue windows.  Calcium Score: LM and 3 vessel calcium  LM: 30.5  LAD 151  LCX 29.9  RCA 260  Total: 471  Coronary Arteries: Right dominant with no anomalies  LM: 1-24% ostial calcified plaque  LAD: 25-49% calcified plaque proximally 1-24% calcified plaque mid vessel  D1: Normal  D2: Normal  Circumflex: 1-24% calcified plaque ostium  OM1: Normal  AV groove: Normal  RCA: 1-24% calcified plaque proximal and mid vessel 25-49% calcified plaque distally  PDA: Normal  PLA: 1-24% calcified plaque  IMPRESSION: 1.  CAD RADS 2 no obstructive CAD see description above  2.  Normal ascending thoracic aorta 3.1 cm  3.  Calcium score 471 which is 85 th percentile for age/sex  Jenkins Rouge  Electronically Signed: By: Jenkins Rouge M.D. On: 07/21/2022 13:26          Recent Labs: 08/15/2022: B Natriuretic Peptide 106.1; BUN 23; Creatinine, Ser 0.72; Hemoglobin 12.9; Magnesium 1.9; Platelets 163; Potassium 3.8; Sodium 140  Recent Lipid Panel No results found for: "CHOL", "TRIG", "HDL", "CHOLHDL", "VLDL", "LDLCALC", "LDLDIRECT"  Physical Exam:    VS:  BP 130/64 (BP Location: Right Arm, Patient Position: Sitting, Cuff Size: Normal)   Pulse 62   Ht 5' 5.5" (1.664 m)   Wt 153 lb (69.4 kg)   SpO2 97%   BMI 25.07 kg/m     Wt Readings from Last 3 Encounters:  08/18/22 153 lb (69.4 kg)  08/15/22 147 lb (66.7 kg)  07/15/22 147 lb (66.7 kg)     GEN:  Well nourished, well developed in no acute distress HEENT: Normal NECK: No JVD; No carotid bruits LYMPHATICS: No lymphadenopathy CARDIAC: RRR, no murmurs, rubs, gallops RESPIRATORY:  Clear to auscultation without rales, wheezing or rhonchi   ABDOMEN: Soft, non-tender, non-distended MUSCULOSKELETAL:  No edema; No deformity  SKIN: Warm and dry NEUROLOGIC:  Alert and oriented x 3 PSYCHIATRIC:  Normal affect    Signed, Shirlee More, MD  08/18/2022 3:23 PM    [facility]

## 2022-08-18 ENCOUNTER — Encounter: Payer: Self-pay | Admitting: Cardiology

## 2022-08-18 ENCOUNTER — Ambulatory Visit: Payer: Medicare PPO | Attending: Cardiology | Admitting: Cardiology

## 2022-08-18 VITALS — BP 130/64 | HR 62 | Ht 65.5 in | Wt 153.0 lb

## 2022-08-18 DIAGNOSIS — R931 Abnormal findings on diagnostic imaging of heart and coronary circulation: Secondary | ICD-10-CM | POA: Diagnosis not present

## 2022-08-18 DIAGNOSIS — E785 Hyperlipidemia, unspecified: Secondary | ICD-10-CM | POA: Diagnosis not present

## 2022-08-18 DIAGNOSIS — I10 Essential (primary) hypertension: Secondary | ICD-10-CM | POA: Diagnosis not present

## 2022-08-18 DIAGNOSIS — I2510 Atherosclerotic heart disease of native coronary artery without angina pectoris: Secondary | ICD-10-CM | POA: Diagnosis not present

## 2022-08-18 DIAGNOSIS — Z7901 Long term (current) use of anticoagulants: Secondary | ICD-10-CM

## 2022-08-18 MED ORDER — AMLODIPINE BESYLATE 5 MG PO TABS: 5.0000 mg | ORAL_TABLET | Freq: Every day | ORAL | 3 refills | Status: DC

## 2022-08-18 MED ORDER — RANOLAZINE ER 500 MG PO TB12: 500.0000 mg | ORAL_TABLET | Freq: Two times a day (BID) | ORAL | 0 refills | Status: DC

## 2022-08-18 MED ORDER — RANOLAZINE ER 1000 MG PO TB12: 1000.0000 mg | ORAL_TABLET | Freq: Two times a day (BID) | ORAL | 3 refills | Status: DC

## 2022-08-18 NOTE — Patient Instructions (Addendum)
Medication Instructions:  Your physician has recommended you make the following change in your medication:   STOP: Imdur STOP: Verapamil START: Amlodipine 5 mg daily START: Ranolazine 500 mg twice daily (for 15 days then) START: Ranolazine 1000 mg twice daily  *If you need a refill on your cardiac medications before your next appointment, please call your pharmacy*   Lab Work: None If you have labs (blood work) drawn today and your tests are completely normal, you will receive your results only by: [place] (if you have MyChart) OR A paper copy in the mail If you have any lab test that is abnormal or we need to change your treatment, we will call you to review the results.   Testing/Procedures: None   Follow-Up: At [facility], you and your health needs are our priority.  As part of our continuing mission to provide you with exceptional heart care, we have created designated Provider Care Teams.  These Care Teams include your primary Cardiologist (physician) and Advanced Practice Providers (APPs -  Physician Assistants and Nurse Practitioners) who all work together to provide you with the care you need, when you need it.  We recommend signing up for the patient portal called "MyChart".  Sign up information is provided on this After Visit Summary.  MyChart is used to connect with patients for Virtual Visits (Telemedicine).  Patients are able to view lab/test results, encounter notes, upcoming appointments, etc.  Non-urgent messages can be sent to your provider as well.   To learn more about what you can do with MyChart, go to [email or web address].    Your next appointment:   4 week(s)  Provider:   Shirlee More, MD    Other Instructions None

## 2022-08-25 ENCOUNTER — Telehealth: Payer: Self-pay | Admitting: *Deleted

## 2022-08-25 NOTE — Telephone Encounter (Signed)
     Patient  visit on 08/15/2022  at [facility] was for chest apin   Have you been able to follow up with your primary care physician?Has followed up with cardiollogy   The patient was  able to obtain any needed medicine or equipment.  Are there diet recommendations that you are having difficulty following?  Patient expresses understanding of discharge instructions and education provided has no other needs at this time.    [place] [phone] [address] , [place] [address] Email : [email or web address]

## 2022-08-26 ENCOUNTER — Other Ambulatory Visit: Payer: Self-pay | Admitting: Cardiology

## 2022-09-06 DIAGNOSIS — E1169 Type 2 diabetes mellitus with other specified complication: Secondary | ICD-10-CM | POA: Diagnosis not present

## 2022-09-06 DIAGNOSIS — E1122 Type 2 diabetes mellitus with diabetic chronic kidney disease: Secondary | ICD-10-CM | POA: Diagnosis not present

## 2022-09-06 DIAGNOSIS — E039 Hypothyroidism, unspecified: Secondary | ICD-10-CM | POA: Diagnosis not present

## 2022-09-08 ENCOUNTER — Other Ambulatory Visit: Payer: Self-pay

## 2022-09-08 ENCOUNTER — Ambulatory Visit: Payer: Medicare PPO | Attending: Cardiology

## 2022-09-08 DIAGNOSIS — I209 Angina pectoris, unspecified: Secondary | ICD-10-CM

## 2022-09-08 NOTE — Progress Notes (Signed)
    Nurse Visit   Date of Encounter: 09/08/2022 ID: Ann Lester, DOB [birth year], MRN [id number]  PCP:  Richardo Priest, MD   [place] Providers Cardiologist:  None      Visit Details   VS:  BP 120/68 (BP Location: Right Arm, Patient Position: Sitting, Cuff Size: Normal)   Pulse 68  , BMI There is no height or weight on file to calculate BMI.  Wt Readings from Last 3 Encounters:  08/18/22 153 lb (69.4 kg)  08/15/22 147 lb (66.7 kg)  07/15/22 147 lb (66.7 kg)     Reason for visit: Perform EKG after starting on Ranolazine Performed today: EKG, Vitals, Education, Provider consulted Changes (medications, testing, etc.) : No new orders Length of Visit: 25 minutes    Medications Adjustments/Labs and Tests Ordered: No orders of the defined types were placed in this encounter.  No orders of the defined types were placed in this encounter.    Signed, Louie Casa, RN  09/08/2022 9:20 AM

## 2022-09-09 DIAGNOSIS — Z6823 Body mass index (BMI) 23.0-23.9, adult: Secondary | ICD-10-CM | POA: Diagnosis not present

## 2022-09-09 DIAGNOSIS — N182 Chronic kidney disease, stage 2 (mild): Secondary | ICD-10-CM | POA: Diagnosis not present

## 2022-09-09 DIAGNOSIS — E1122 Type 2 diabetes mellitus with diabetic chronic kidney disease: Secondary | ICD-10-CM | POA: Diagnosis not present

## 2022-09-09 DIAGNOSIS — E039 Hypothyroidism, unspecified: Secondary | ICD-10-CM | POA: Diagnosis not present

## 2022-09-09 DIAGNOSIS — E1169 Type 2 diabetes mellitus with other specified complication: Secondary | ICD-10-CM | POA: Diagnosis not present

## 2022-09-09 DIAGNOSIS — E782 Mixed hyperlipidemia: Secondary | ICD-10-CM | POA: Diagnosis not present

## 2022-09-09 DIAGNOSIS — I129 Hypertensive chronic kidney disease with stage 1 through stage 4 chronic kidney disease, or unspecified chronic kidney disease: Secondary | ICD-10-CM | POA: Diagnosis not present

## 2022-09-13 DIAGNOSIS — E1122 Type 2 diabetes mellitus with diabetic chronic kidney disease: Secondary | ICD-10-CM | POA: Diagnosis not present

## 2022-09-13 DIAGNOSIS — E669 Obesity, unspecified: Secondary | ICD-10-CM | POA: Diagnosis not present

## 2022-09-15 NOTE — Progress Notes (Unsigned)
Cardiology Office Note:    Date:  09/16/2022   ID:  Ann Lester, DOB February 01, 1946, MRN [id number]  PCP:  Marco Collie, MD  Cardiologist:  Shirlee More, MD    Referring MD: Richardo Priest, MD    ASSESSMENT:    1. Mild CAD   2. Primary hypertension   3. Agatston coronary artery calcium score greater than 400   4. Hyperlipidemia, unspecified hyperlipidemia type   5. Chronic anticoagulation    PLAN:    In order of problems listed above:  Patient had a very nice response to ranolazine and amlodipine for ongoing angina with mild nonobstructive CAD not uncommon in the female population.  She will continue her current therapy including anticoagulant beta-blocker calcium channel blocker ranolazine and lipid-lowering therapy.   Next appointment: 6 months   Medication Adjustments/Labs and Tests Ordered: Current medicines are reviewed at length with the patient today.  Concerns regarding medicines are outlined above.  No orders of the defined types were placed in this encounter.  No orders of the defined types were placed in this encounter.   Chief Complaint  Patient presents with   Follow-up   Coronary Artery Disease    History of Present Illness:    Ann Lester is a 77 y.o. female with a hx of factor V Leiden mutation with long-term anticoagulation previous SVT coronary artery calcification and angina with normal coronary arteriography and most recently cardiac CTA February 2024 showing mild nonobstructive CAD and a coronary calcium score 471 last seen 08/18/2022 with continued typical angina.  Her medications were  optimized discontinued oral nitrate transition from verapamil to amlodipine and initiating ranolazine therapy.  EKG 09/08/2022: Sinus rhythm normal QT interval  Compliance with diet, lifestyle and medications: Yes  Reviewed response to her normal symptoms of no pharyngeal discomfort or extremities.  No ordered to monitor Uses a smart watch she has  intermittent momentary palpitation he cannot Change the EKG report it is still very She tolerates her statin without muscle pain or weakness in her anticoagulant without bleeding Past Medical History:  Diagnosis Date   Controlled diabetes mellitus with peripheral circulatory disorder 04/13/2022   Coronary artery disease    History of pulmonary embolism 05/16/2015   Hyperlipidemia    Hypertension    Obesity 10/23/2021   Osteoporosis 04/13/2022   Psoriatic arthritis    Vitamin D deficiency 10/23/2021    Past Surgical History:  Procedure Laterality Date   APPENDECTOMY  04/06/2011   CERVICAL DISC SURGERY     CESAREAN SECTION     X2   ORIF PATELLA FRACTURE  04/06/2011   SHOULDER ARTHROSCOPY WITH ROTATOR CUFF REPAIR Right 02/02/2021   TOTAL HIP ARTHROPLASTY Left 10/23/2021   Procedure: TOTAL HIP ARTHROPLASTY ANTERIOR APPROACH;  Surgeon: Rod Can, MD;  Location: [facility];  Service: Orthopedics;  Laterality: Left;    Current Medications: Current Meds  Medication Sig   acetaminophen (TYLENOL) 500 MG tablet Take 1,000 mg by mouth at bedtime as needed for mild pain.   amoxicillin (AMOXIL) 500 MG tablet Take 2,000 mg by mouth as directed. 1-2 Hours prior to dental work   bismuth subsalicylate (PEPTO BISMOL) 262 MG/15ML suspension Take 30 mLs by mouth every 6 (six) hours as needed for indigestion or diarrhea or loose stools.   cholecalciferol (VITAMIN D3) 25 MCG (1000 UNIT) tablet Take 1,000 Units by mouth daily.   Coenzyme Q10 (CO Q-10 PO) Take 1 capsule by mouth daily. Unsure of dose   COSENTYX 150 MG/ML  SOSY Inject 150 mg into the skin every 30 (thirty) days.   folic acid (FOLVITE) 1 MG tablet Take 1 mg by mouth daily.   leflunomide (ARAVA) 20 MG tablet Take 20 mg by mouth daily.   levothyroxine (SYNTHROID) 88 MCG tablet Take 88 mcg by mouth daily.   metoprolol tartrate (LOPRESSOR) 25 MG tablet Take 1 tablet (25 mg total) by mouth 2 (two) times daily.   Milk Thistle 1000 MG CAPS Take  1,000 mg by mouth daily.   nitroGLYCERIN (NITROSTAT) 0.4 MG SL tablet Place 1 tablet (0.4 mg total) under the tongue every 5 (five) minutes as needed for chest pain.   ranolazine (RANEXA) 1000 MG SR tablet Take 1 tablet (1,000 mg total) by mouth 2 (two) times daily.   rosuvastatin (CRESTOR) 20 MG tablet Take 20 mg by mouth 2 (two) times a week.   XARELTO 20 MG TABS tablet Take 20 mg by mouth every evening.     Allergies:   Patient has no known allergies.   Social History   Socioeconomic History   Marital status: Married    Spouse name: Not on file   Number of children: Not on file   Years of education: Not on file   Highest education level: Not on file  Occupational History   Not on file  Tobacco Use   Smoking status: Former    Types: Cigarettes    Passive exposure: Past   Smokeless tobacco: Never  Vaping Use   Vaping Use: Never used  Substance and Sexual Activity   Alcohol use: Yes    Alcohol/week: 14.0 standard drinks of alcohol    Types: 14 Standard drinks or equivalent per week   Drug use: Never   Sexual activity: Not on file  Other Topics Concern   Not on file  Social History Narrative   Not on file   Social Determinants of Health   Financial Resource Strain: Not on file  Food Insecurity: Not on file  Transportation Needs: Not on file  Physical Activity: Not on file  Stress: Not on file  Social Connections: Not on file     Family History: The patient's family history includes Hypertension in her father; Lung cancer in her mother. ROS:   Please see the history of present illness.    All other systems reviewed and are negative.  EKGs/Labs/Other Studies Reviewed:    The following studies were reviewed today:  Cardiac Studies & Procedures     STRESS TESTS  MYOCARDIAL PERFUSION IMAGING 05/19/2022     MONITORS  LONG TERM MONITOR (3-14 DAYS) 05/14/2022   CT SCANS  CT CORONARY MORPH W/CTA COR W/SCORE 07/22/2022  Addendum 07/22/2022  9:10 PM ADDENDUM  REPORT: 07/22/2022 21:07  EXAM: OVER-READ INTERPRETATION  CT CHEST  The following report is an over-read performed by radiologist Dr. Collene Leyden [facility] on 07/22/2022. This over-read does not include interpretation of cardiac or coronary anatomy or pathology. The coronary CTA interpretation by the cardiologist is attached.  COMPARISON:  02/08/2022  FINDINGS: Heart is normal size. Aorta normal caliber. No adenopathy. No confluent airspace opacities or effusions. No acute findings in the upper abdomen. Cyst within the left hepatic lobe is stable since prior study. Chest wall soft tissues are unremarkable. No acute bony abnormality.  IMPRESSION: No acute or significant extracardiac abnormality.   Electronically Signed By: Rolm Baptise M.D. On: 07/22/2022 21:07  Narrative CLINICAL DATA:  Chest pain  EXAM: Cardiac CTA  MEDICATIONS: Sub lingual nitro. 4mg   and lopressor 100mg   TECHNIQUE: The patient was scanned on a Siemens Force [id number] slice scanner. Gantry rotation speed was 250 msecs. Collimation was .6 mm. A 120 kV prospective scan was triggered in the ascending thoracic aorta at 140 HU's Full mA was used between 35% and 75% of the R-R interval. Average HR during the scan was 58 bpm. The 3D data set was interpreted on a dedicated work station using MPR, MIP and VRT modes. A total of 80cc of contrast was used.  FINDINGS: Non-cardiac: See separate report from [facility]. No significant findings on limited lung and soft tissue windows.  Calcium Score: LM and 3 vessel calcium  LM: 30.5  LAD 151  LCX 29.9  RCA 260  Total: 471  Coronary Arteries: Right dominant with no anomalies  LM: 1-24% ostial calcified plaque  LAD: 25-49% calcified plaque proximally 1-24% calcified plaque mid vessel  D1: Normal  D2: Normal  Circumflex: 1-24% calcified plaque ostium  OM1: Normal  AV groove: Normal  RCA: 1-24% calcified plaque proximal  and mid vessel 25-49% calcified plaque distally  PDA: Normal  PLA: 1-24% calcified plaque  IMPRESSION: 1.  CAD RADS 2 no obstructive CAD see description above  2.  Normal ascending thoracic aorta 3.1 cm  3.  Calcium score 471 which is 85 th percentile for age/sex  Jenkins Rouge  Electronically Signed: By: Jenkins Rouge M.D. On: 07/21/2022 13:26           Recent Labs: 08/15/2022: B Natriuretic Peptide 106.1; BUN 23; Creatinine, Ser 0.72; Hemoglobin 12.9; Magnesium 1.9; Platelets 163; Potassium 3.8; Sodium 140  Recent Lipid Panel 09/06/2022 cholesterol 197 LDL 83  Physical Exam:    VS:  BP (!) 144/72 (BP Location: Left Arm, Patient Position: Sitting)   Pulse 78   Ht 5' 5.5" (1.664 m)   Wt 150 lb (68 kg)   SpO2 99%   BMI 24.58 kg/m     Wt Readings from Last 3 Encounters:  09/16/22 150 lb (68 kg)  08/18/22 153 lb (69.4 kg)  08/15/22 147 lb (66.7 kg)     GEN:  Well nourished, well developed in no acute distress HEENT: Normal NECK: No JVD; No carotid bruits LYMPHATICS: No lymphadenopathy CARDIAC: RRR, no murmurs, rubs, gallops RESPIRATORY:  Clear to auscultation without rales, wheezing or rhonchi  ABDOMEN: Soft, non-tender, non-distended MUSCULOSKELETAL:  No edema; No deformity  SKIN: Warm and dry NEUROLOGIC:  Alert and oriented x 3 PSYCHIATRIC:  Normal affect    Signed, Shirlee More, MD  09/16/2022 9:00 AM    [facility]

## 2022-09-16 ENCOUNTER — Ambulatory Visit: Payer: Medicare PPO | Attending: Cardiology | Admitting: Cardiology

## 2022-09-16 ENCOUNTER — Encounter: Payer: Self-pay | Admitting: Cardiology

## 2022-09-16 VITALS — BP 144/72 | HR 78 | Ht 65.5 in | Wt 150.0 lb

## 2022-09-16 DIAGNOSIS — R931 Abnormal findings on diagnostic imaging of heart and coronary circulation: Secondary | ICD-10-CM

## 2022-09-16 DIAGNOSIS — Z7901 Long term (current) use of anticoagulants: Secondary | ICD-10-CM

## 2022-09-16 DIAGNOSIS — I10 Essential (primary) hypertension: Secondary | ICD-10-CM | POA: Diagnosis not present

## 2022-09-16 DIAGNOSIS — I2510 Atherosclerotic heart disease of native coronary artery without angina pectoris: Secondary | ICD-10-CM | POA: Diagnosis not present

## 2022-09-16 DIAGNOSIS — E785 Hyperlipidemia, unspecified: Secondary | ICD-10-CM

## 2022-09-16 NOTE — Patient Instructions (Signed)
Medication Instructions:  Your physician recommends that you continue on your current medications as directed. Please refer to the Current Medication list given to you today.  *If you need a refill on your cardiac medications before your next appointment, please call your pharmacy*   Lab Work: None If you have labs (blood work) drawn today and your tests are completely normal, you will receive your results only by: MyChart Message (if you have MyChart) OR A paper copy in the mail If you have any lab test that is abnormal or we need to change your treatment, we will call you to review the results.   Testing/Procedures: None   Follow-Up: At [facility], you and your health needs are our priority.  As part of our continuing mission to provide you with exceptional heart care, we have created designated Provider Care Teams.  These Care Teams include your primary Cardiologist (physician) and Advanced Practice Providers (APPs -  Physician Assistants and Nurse Practitioners) who all work together to provide you with the care you need, when you need it.  We recommend signing up for the patient portal called "MyChart".  Sign up information is provided on this After Visit Summary.  MyChart is used to connect with patients for Virtual Visits (Telemedicine).  Patients are able to view lab/test results, encounter notes, upcoming appointments, etc.  Non-urgent messages can be sent to your provider as well.   To learn more about what you can do with MyChart, go to [email or web address].    Your next appointment:   6 month(s)  Provider:   Brian Munley, MD    Other Instructions None  

## 2022-09-23 DIAGNOSIS — Z6823 Body mass index (BMI) 23.0-23.9, adult: Secondary | ICD-10-CM | POA: Diagnosis not present

## 2022-09-23 DIAGNOSIS — M5432 Sciatica, left side: Secondary | ICD-10-CM | POA: Diagnosis not present

## 2022-09-23 DIAGNOSIS — L4050 Arthropathic psoriasis, unspecified: Secondary | ICD-10-CM | POA: Diagnosis not present

## 2022-09-26 DIAGNOSIS — M069 Rheumatoid arthritis, unspecified: Secondary | ICD-10-CM | POA: Diagnosis not present

## 2022-09-26 DIAGNOSIS — G8929 Other chronic pain: Secondary | ICD-10-CM | POA: Diagnosis not present

## 2022-09-26 DIAGNOSIS — I10 Essential (primary) hypertension: Secondary | ICD-10-CM | POA: Diagnosis not present

## 2022-09-26 DIAGNOSIS — M546 Pain in thoracic spine: Secondary | ICD-10-CM | POA: Diagnosis not present

## 2022-09-26 DIAGNOSIS — M545 Low back pain, unspecified: Secondary | ICD-10-CM | POA: Diagnosis not present

## 2022-09-29 ENCOUNTER — Telehealth: Payer: Self-pay

## 2022-09-29 DIAGNOSIS — M549 Dorsalgia, unspecified: Secondary | ICD-10-CM | POA: Diagnosis not present

## 2022-09-29 DIAGNOSIS — M79676 Pain in unspecified toe(s): Secondary | ICD-10-CM | POA: Diagnosis not present

## 2022-09-29 DIAGNOSIS — Z79899 Other long term (current) drug therapy: Secondary | ICD-10-CM | POA: Diagnosis not present

## 2022-09-29 DIAGNOSIS — M810 Age-related osteoporosis without current pathological fracture: Secondary | ICD-10-CM | POA: Diagnosis not present

## 2022-09-29 DIAGNOSIS — L409 Psoriasis, unspecified: Secondary | ICD-10-CM | POA: Diagnosis not present

## 2022-09-29 DIAGNOSIS — L4050 Arthropathic psoriasis, unspecified: Secondary | ICD-10-CM | POA: Diagnosis not present

## 2022-09-29 DIAGNOSIS — M1990 Unspecified osteoarthritis, unspecified site: Secondary | ICD-10-CM | POA: Diagnosis not present

## 2022-09-29 NOTE — Telephone Encounter (Signed)
        Patient  visited [place] on 4/14     Telephone encounter attempt :  1st  A HIPAA compliant voice message was left requesting a return call.  Instructed patient to call back     Lenard Forth [facility] Guide, [facility] [phone] [address] [place], [place], [place] [address] Phone: [phone] Email: [email or web address]

## 2022-09-29 NOTE — Telephone Encounter (Signed)
     Patient  visit on 4/14  at [place]   Have you been able to follow up with your primary care physician? Yes   The patient was or was not able to obtain any needed medicine or equipment. Yes   Are there diet recommendations that you are having difficulty following? Na   Patient expresses understanding of discharge instructions and education provided has no other needs at this time.  Yes     Ann Lester [facility] Guide, [place] [phone] [address], [place], [place] [address] Phone: [phone] Email: [email or web address]    

## 2022-09-30 DIAGNOSIS — Z6824 Body mass index (BMI) 24.0-24.9, adult: Secondary | ICD-10-CM | POA: Diagnosis not present

## 2022-09-30 DIAGNOSIS — Z7689 Persons encountering health services in other specified circumstances: Secondary | ICD-10-CM | POA: Diagnosis not present

## 2022-09-30 DIAGNOSIS — M545 Low back pain, unspecified: Secondary | ICD-10-CM | POA: Diagnosis not present

## 2022-10-11 DIAGNOSIS — R531 Weakness: Secondary | ICD-10-CM | POA: Diagnosis not present

## 2022-10-11 DIAGNOSIS — M5416 Radiculopathy, lumbar region: Secondary | ICD-10-CM | POA: Diagnosis not present

## 2022-10-11 DIAGNOSIS — R262 Difficulty in walking, not elsewhere classified: Secondary | ICD-10-CM | POA: Diagnosis not present

## 2022-10-11 DIAGNOSIS — M25552 Pain in left hip: Secondary | ICD-10-CM | POA: Diagnosis not present

## 2022-10-12 ENCOUNTER — Ambulatory Visit: Payer: Medicare PPO | Admitting: Cardiology

## 2022-10-12 DIAGNOSIS — M5416 Radiculopathy, lumbar region: Secondary | ICD-10-CM | POA: Diagnosis not present

## 2022-10-13 DIAGNOSIS — E1122 Type 2 diabetes mellitus with diabetic chronic kidney disease: Secondary | ICD-10-CM | POA: Diagnosis not present

## 2022-10-13 DIAGNOSIS — E669 Obesity, unspecified: Secondary | ICD-10-CM | POA: Diagnosis not present

## 2022-10-14 DIAGNOSIS — R531 Weakness: Secondary | ICD-10-CM | POA: Diagnosis not present

## 2022-10-14 DIAGNOSIS — R262 Difficulty in walking, not elsewhere classified: Secondary | ICD-10-CM | POA: Diagnosis not present

## 2022-10-14 DIAGNOSIS — M5416 Radiculopathy, lumbar region: Secondary | ICD-10-CM | POA: Diagnosis not present

## 2022-10-14 DIAGNOSIS — M25552 Pain in left hip: Secondary | ICD-10-CM | POA: Diagnosis not present

## 2022-10-18 DIAGNOSIS — M25552 Pain in left hip: Secondary | ICD-10-CM | POA: Diagnosis not present

## 2022-10-18 DIAGNOSIS — M5416 Radiculopathy, lumbar region: Secondary | ICD-10-CM | POA: Diagnosis not present

## 2022-10-18 DIAGNOSIS — R262 Difficulty in walking, not elsewhere classified: Secondary | ICD-10-CM | POA: Diagnosis not present

## 2022-10-18 DIAGNOSIS — R531 Weakness: Secondary | ICD-10-CM | POA: Diagnosis not present

## 2022-10-20 DIAGNOSIS — M25552 Pain in left hip: Secondary | ICD-10-CM | POA: Diagnosis not present

## 2022-10-20 DIAGNOSIS — R262 Difficulty in walking, not elsewhere classified: Secondary | ICD-10-CM | POA: Diagnosis not present

## 2022-10-20 DIAGNOSIS — R531 Weakness: Secondary | ICD-10-CM | POA: Diagnosis not present

## 2022-10-20 DIAGNOSIS — M5416 Radiculopathy, lumbar region: Secondary | ICD-10-CM | POA: Diagnosis not present

## 2022-10-26 DIAGNOSIS — R531 Weakness: Secondary | ICD-10-CM | POA: Diagnosis not present

## 2022-10-26 DIAGNOSIS — M5416 Radiculopathy, lumbar region: Secondary | ICD-10-CM | POA: Diagnosis not present

## 2022-10-26 DIAGNOSIS — M25552 Pain in left hip: Secondary | ICD-10-CM | POA: Diagnosis not present

## 2022-10-26 DIAGNOSIS — R262 Difficulty in walking, not elsewhere classified: Secondary | ICD-10-CM | POA: Diagnosis not present

## 2022-10-27 DIAGNOSIS — M5416 Radiculopathy, lumbar region: Secondary | ICD-10-CM | POA: Diagnosis not present

## 2022-10-27 DIAGNOSIS — R262 Difficulty in walking, not elsewhere classified: Secondary | ICD-10-CM | POA: Diagnosis not present

## 2022-10-27 DIAGNOSIS — M25552 Pain in left hip: Secondary | ICD-10-CM | POA: Diagnosis not present

## 2022-10-27 DIAGNOSIS — R531 Weakness: Secondary | ICD-10-CM | POA: Diagnosis not present

## 2022-11-02 DIAGNOSIS — R531 Weakness: Secondary | ICD-10-CM | POA: Diagnosis not present

## 2022-11-02 DIAGNOSIS — M5416 Radiculopathy, lumbar region: Secondary | ICD-10-CM | POA: Diagnosis not present

## 2022-11-02 DIAGNOSIS — R262 Difficulty in walking, not elsewhere classified: Secondary | ICD-10-CM | POA: Diagnosis not present

## 2022-11-02 DIAGNOSIS — M25552 Pain in left hip: Secondary | ICD-10-CM | POA: Diagnosis not present

## 2022-11-04 DIAGNOSIS — M5416 Radiculopathy, lumbar region: Secondary | ICD-10-CM | POA: Diagnosis not present

## 2022-11-04 DIAGNOSIS — R262 Difficulty in walking, not elsewhere classified: Secondary | ICD-10-CM | POA: Diagnosis not present

## 2022-11-04 DIAGNOSIS — M25552 Pain in left hip: Secondary | ICD-10-CM | POA: Diagnosis not present

## 2022-11-04 DIAGNOSIS — R531 Weakness: Secondary | ICD-10-CM | POA: Diagnosis not present

## 2022-11-10 DIAGNOSIS — R262 Difficulty in walking, not elsewhere classified: Secondary | ICD-10-CM | POA: Diagnosis not present

## 2022-11-10 DIAGNOSIS — M25552 Pain in left hip: Secondary | ICD-10-CM | POA: Diagnosis not present

## 2022-11-10 DIAGNOSIS — M5416 Radiculopathy, lumbar region: Secondary | ICD-10-CM | POA: Diagnosis not present

## 2022-11-10 DIAGNOSIS — R531 Weakness: Secondary | ICD-10-CM | POA: Diagnosis not present

## 2022-11-12 DIAGNOSIS — R262 Difficulty in walking, not elsewhere classified: Secondary | ICD-10-CM | POA: Diagnosis not present

## 2022-11-12 DIAGNOSIS — M5416 Radiculopathy, lumbar region: Secondary | ICD-10-CM | POA: Diagnosis not present

## 2022-11-12 DIAGNOSIS — R531 Weakness: Secondary | ICD-10-CM | POA: Diagnosis not present

## 2022-11-12 DIAGNOSIS — M25552 Pain in left hip: Secondary | ICD-10-CM | POA: Diagnosis not present

## 2022-11-13 DIAGNOSIS — E1122 Type 2 diabetes mellitus with diabetic chronic kidney disease: Secondary | ICD-10-CM | POA: Diagnosis not present

## 2022-11-13 DIAGNOSIS — E669 Obesity, unspecified: Secondary | ICD-10-CM | POA: Diagnosis not present

## 2022-11-16 DIAGNOSIS — R531 Weakness: Secondary | ICD-10-CM | POA: Diagnosis not present

## 2022-11-16 DIAGNOSIS — R262 Difficulty in walking, not elsewhere classified: Secondary | ICD-10-CM | POA: Diagnosis not present

## 2022-11-16 DIAGNOSIS — M25552 Pain in left hip: Secondary | ICD-10-CM | POA: Diagnosis not present

## 2022-11-16 DIAGNOSIS — M5416 Radiculopathy, lumbar region: Secondary | ICD-10-CM | POA: Diagnosis not present

## 2022-11-25 DIAGNOSIS — M25552 Pain in left hip: Secondary | ICD-10-CM | POA: Diagnosis not present

## 2022-11-25 DIAGNOSIS — R262 Difficulty in walking, not elsewhere classified: Secondary | ICD-10-CM | POA: Diagnosis not present

## 2022-11-25 DIAGNOSIS — R531 Weakness: Secondary | ICD-10-CM | POA: Diagnosis not present

## 2022-11-25 DIAGNOSIS — M5416 Radiculopathy, lumbar region: Secondary | ICD-10-CM | POA: Diagnosis not present

## 2022-12-02 DIAGNOSIS — M7062 Trochanteric bursitis, left hip: Secondary | ICD-10-CM | POA: Diagnosis not present

## 2022-12-02 DIAGNOSIS — D6869 Other thrombophilia: Secondary | ICD-10-CM | POA: Diagnosis not present

## 2022-12-02 DIAGNOSIS — Z6823 Body mass index (BMI) 23.0-23.9, adult: Secondary | ICD-10-CM | POA: Diagnosis not present

## 2022-12-03 DIAGNOSIS — M7062 Trochanteric bursitis, left hip: Secondary | ICD-10-CM | POA: Diagnosis not present

## 2022-12-10 DIAGNOSIS — M5416 Radiculopathy, lumbar region: Secondary | ICD-10-CM | POA: Diagnosis not present

## 2022-12-13 DIAGNOSIS — E1122 Type 2 diabetes mellitus with diabetic chronic kidney disease: Secondary | ICD-10-CM | POA: Diagnosis not present

## 2022-12-13 DIAGNOSIS — E669 Obesity, unspecified: Secondary | ICD-10-CM | POA: Diagnosis not present

## 2022-12-23 DIAGNOSIS — M5416 Radiculopathy, lumbar region: Secondary | ICD-10-CM | POA: Diagnosis not present

## 2022-12-31 DIAGNOSIS — L4050 Arthropathic psoriasis, unspecified: Secondary | ICD-10-CM | POA: Diagnosis not present

## 2022-12-31 DIAGNOSIS — M549 Dorsalgia, unspecified: Secondary | ICD-10-CM | POA: Diagnosis not present

## 2022-12-31 DIAGNOSIS — M79676 Pain in unspecified toe(s): Secondary | ICD-10-CM | POA: Diagnosis not present

## 2022-12-31 DIAGNOSIS — M810 Age-related osteoporosis without current pathological fracture: Secondary | ICD-10-CM | POA: Diagnosis not present

## 2022-12-31 DIAGNOSIS — R748 Abnormal levels of other serum enzymes: Secondary | ICD-10-CM | POA: Diagnosis not present

## 2022-12-31 DIAGNOSIS — L409 Psoriasis, unspecified: Secondary | ICD-10-CM | POA: Diagnosis not present

## 2022-12-31 DIAGNOSIS — M1990 Unspecified osteoarthritis, unspecified site: Secondary | ICD-10-CM | POA: Diagnosis not present

## 2022-12-31 DIAGNOSIS — Z79899 Other long term (current) drug therapy: Secondary | ICD-10-CM | POA: Diagnosis not present

## 2023-01-10 DIAGNOSIS — E1169 Type 2 diabetes mellitus with other specified complication: Secondary | ICD-10-CM | POA: Diagnosis not present

## 2023-01-10 DIAGNOSIS — E1122 Type 2 diabetes mellitus with diabetic chronic kidney disease: Secondary | ICD-10-CM | POA: Diagnosis not present

## 2023-01-10 DIAGNOSIS — E039 Hypothyroidism, unspecified: Secondary | ICD-10-CM | POA: Diagnosis not present

## 2023-01-13 DIAGNOSIS — E1122 Type 2 diabetes mellitus with diabetic chronic kidney disease: Secondary | ICD-10-CM | POA: Diagnosis not present

## 2023-01-13 DIAGNOSIS — E669 Obesity, unspecified: Secondary | ICD-10-CM | POA: Diagnosis not present

## 2023-01-14 DIAGNOSIS — M5416 Radiculopathy, lumbar region: Secondary | ICD-10-CM | POA: Diagnosis not present

## 2023-01-17 DIAGNOSIS — E1122 Type 2 diabetes mellitus with diabetic chronic kidney disease: Secondary | ICD-10-CM | POA: Diagnosis not present

## 2023-01-17 DIAGNOSIS — E039 Hypothyroidism, unspecified: Secondary | ICD-10-CM | POA: Diagnosis not present

## 2023-01-17 DIAGNOSIS — Z1331 Encounter for screening for depression: Secondary | ICD-10-CM | POA: Diagnosis not present

## 2023-01-17 DIAGNOSIS — Z1389 Encounter for screening for other disorder: Secondary | ICD-10-CM | POA: Diagnosis not present

## 2023-01-17 DIAGNOSIS — N182 Chronic kidney disease, stage 2 (mild): Secondary | ICD-10-CM | POA: Diagnosis not present

## 2023-01-17 DIAGNOSIS — I129 Hypertensive chronic kidney disease with stage 1 through stage 4 chronic kidney disease, or unspecified chronic kidney disease: Secondary | ICD-10-CM | POA: Diagnosis not present

## 2023-01-17 DIAGNOSIS — Z1339 Encounter for screening examination for other mental health and behavioral disorders: Secondary | ICD-10-CM | POA: Diagnosis not present

## 2023-01-17 DIAGNOSIS — Z0000 Encounter for general adult medical examination without abnormal findings: Secondary | ICD-10-CM | POA: Diagnosis not present

## 2023-01-17 DIAGNOSIS — Z139 Encounter for screening, unspecified: Secondary | ICD-10-CM | POA: Diagnosis not present

## 2023-01-19 ENCOUNTER — Other Ambulatory Visit: Payer: Self-pay | Admitting: Family Medicine

## 2023-01-19 DIAGNOSIS — Z1231 Encounter for screening mammogram for malignant neoplasm of breast: Secondary | ICD-10-CM

## 2023-01-25 DIAGNOSIS — M810 Age-related osteoporosis without current pathological fracture: Secondary | ICD-10-CM | POA: Diagnosis not present

## 2023-01-26 ENCOUNTER — Inpatient Hospital Stay: Admission: RE | Admit: 2023-01-26 | Payer: Medicare PPO | Source: Ambulatory Visit

## 2023-01-26 DIAGNOSIS — Z1231 Encounter for screening mammogram for malignant neoplasm of breast: Secondary | ICD-10-CM | POA: Diagnosis not present

## 2023-01-31 DIAGNOSIS — M542 Cervicalgia: Secondary | ICD-10-CM | POA: Diagnosis not present

## 2023-01-31 DIAGNOSIS — M25552 Pain in left hip: Secondary | ICD-10-CM | POA: Diagnosis not present

## 2023-01-31 DIAGNOSIS — R531 Weakness: Secondary | ICD-10-CM | POA: Diagnosis not present

## 2023-02-02 IMAGING — MR MR SHOULDER*R* W/O CM
4 of 5 series · 20 of 40 positions shown · non-contrast
Comparison: Right shoulder x-rays dated November 08, 2019.

CLINICAL DATA: Chronic right shoulder pain and limited range of
motion. No prior surgery.

EXAM:
MRI OF THE RIGHT SHOULDER WITHOUT CONTRAST
TECHNIQUE: Multiplanar, multisequence MR imaging of the shoulder was performed.
No intravenous contrast was administered.

[Series 6: T2 fat-sat · axial · right · 3.0mm · 0.47mm/px · z∈[-33,+57]mm · 7 of 27 slices shown (1 of 3)]
[im 1/27]
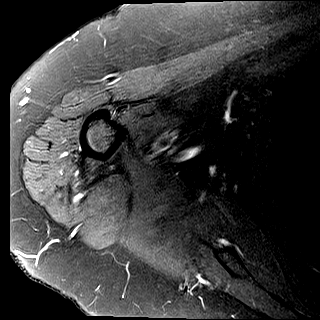
[im 3/27]
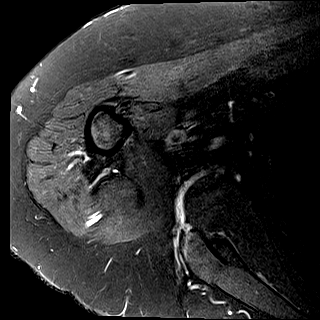
[im 9/27]
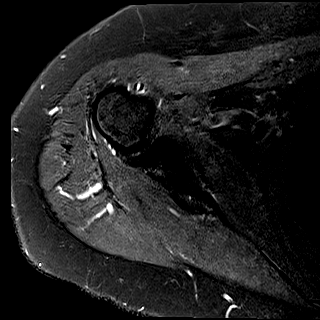
[im 12/27]
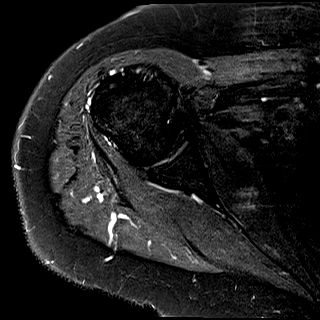
[im 15/27]
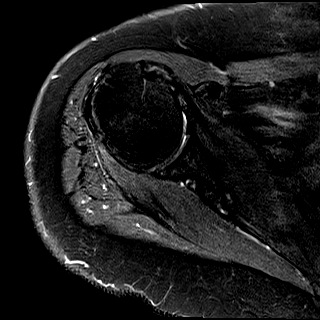
[im 18/27]
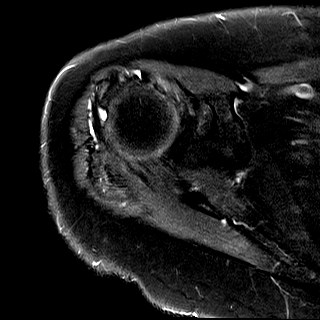
[im 24/27]
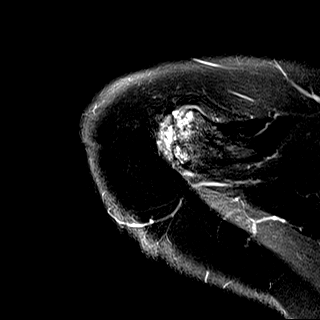

[Series 7: T2 fat-sat · oblique · right · 4.0mm · 0.22mm/px · 3 of 21 slices shown (2 of 3)]
[im 4/21]
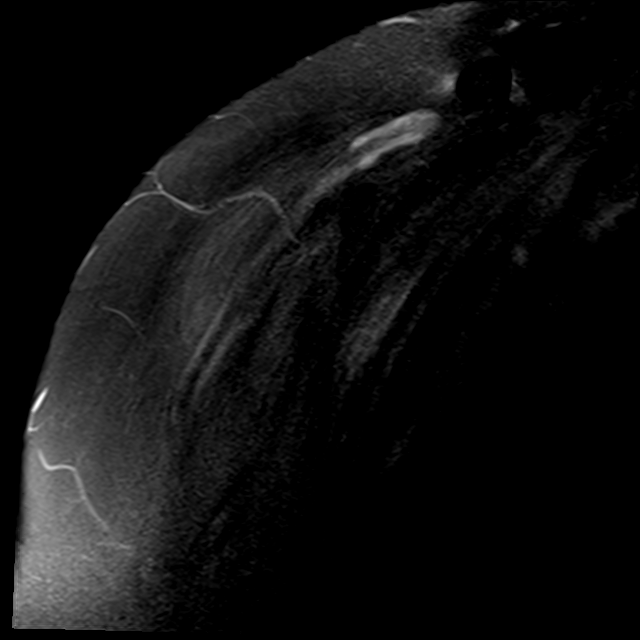
[im 11/21]
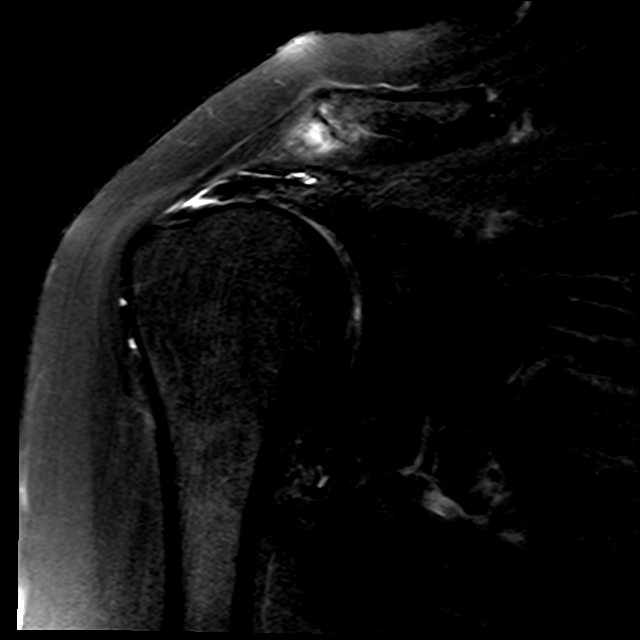
[im 17/21]
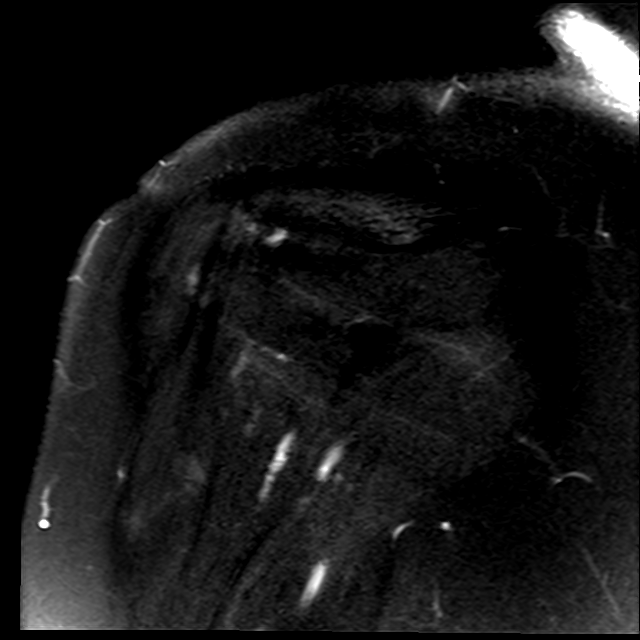

[Series 8: T2 fat-sat · oblique · right · 4.0mm · 0.44mm/px · 3 of 23 slices shown (3 of 3)]
[im 4/23]
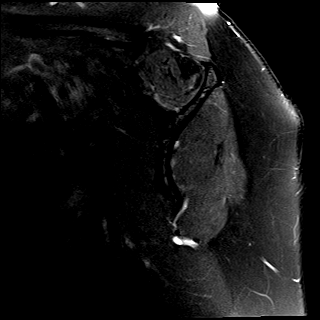
[im 13/23]
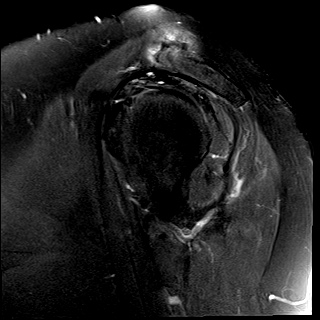
[im 19/23]
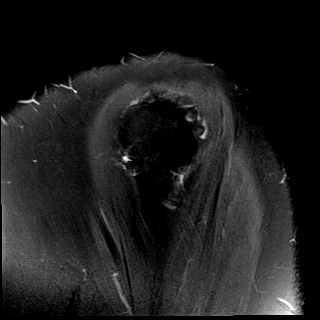

[Series 9: PD · oblique · right · 4.0mm · 0.22mm/px · 7 of 21 slices shown]
[im 1/21]
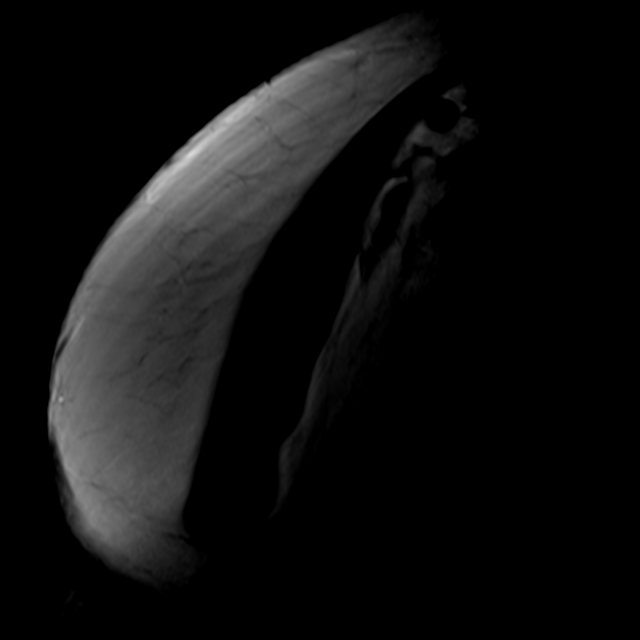
[im 4/21]
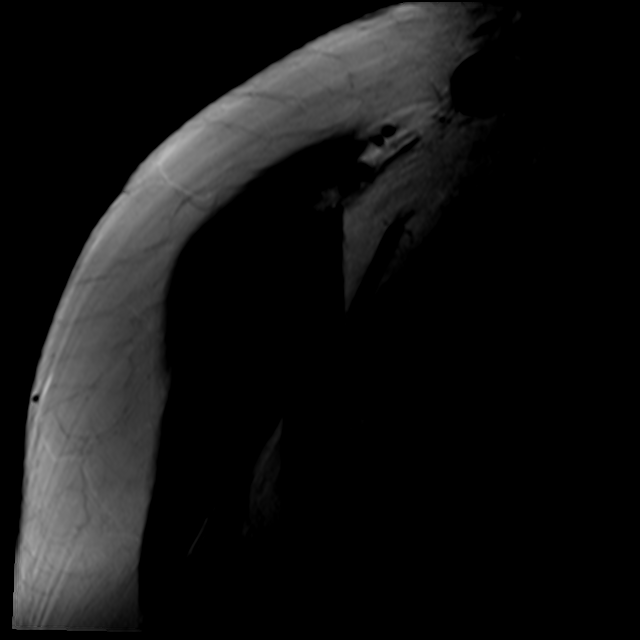
[im 7/21]
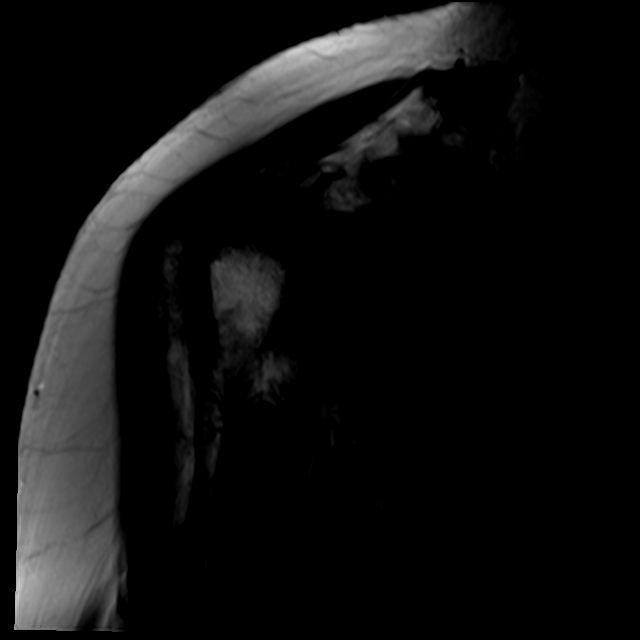
[im 11/21]
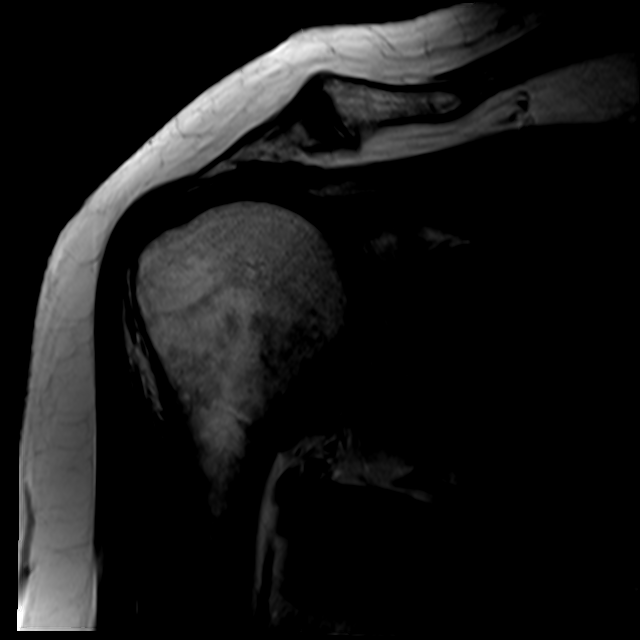
[im 14/21]
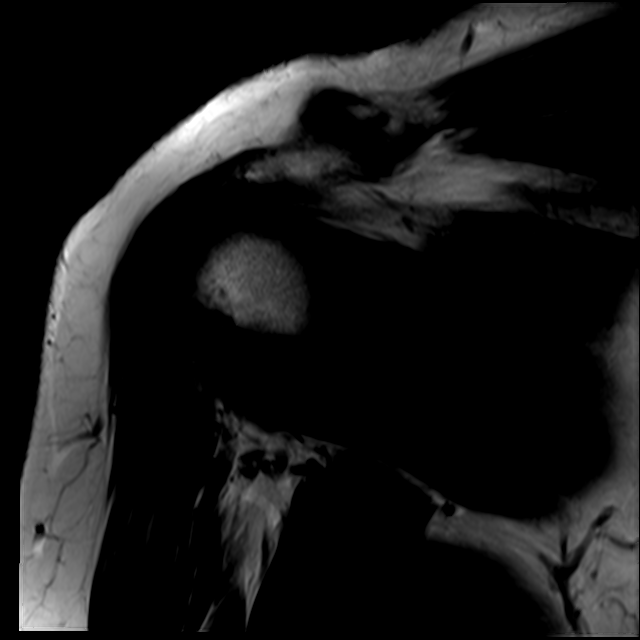
[im 17/21]
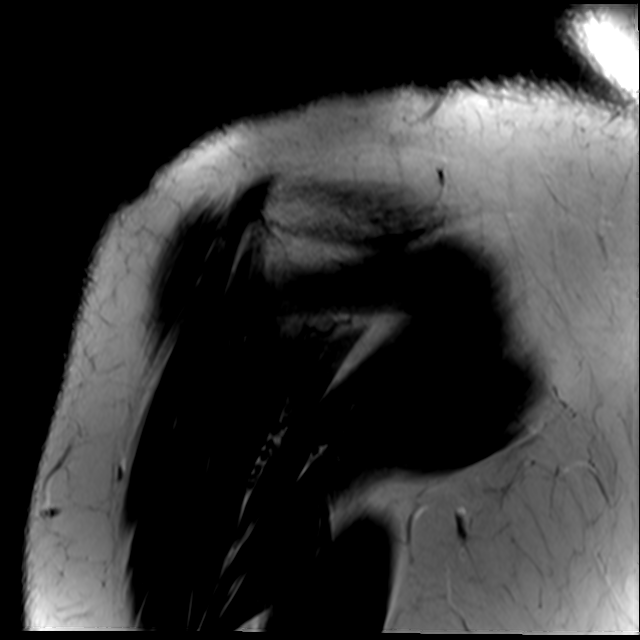
[im 21/21]
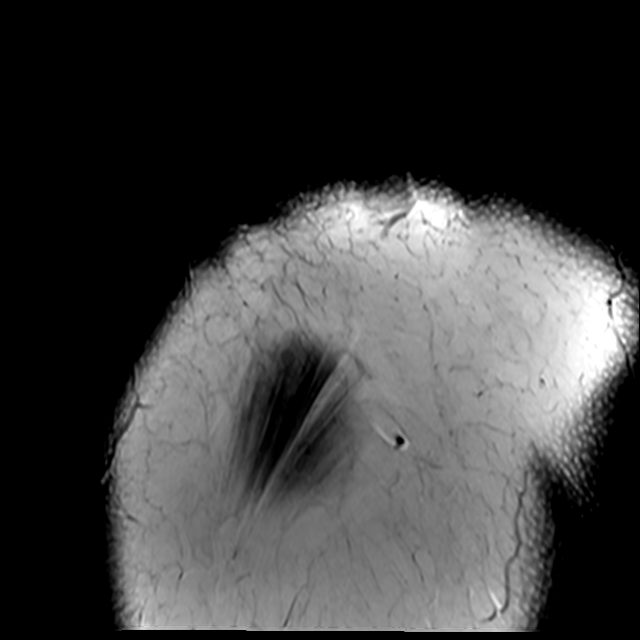

[20 of 40 positions shown; findings below may reference images not displayed]

FINDINGS: Rotator cuff: Mild supraspinatus tendinosis with high-grade
partial-thickness bursal surface tear anteriorly at the insertion
and small focal full-thickness component more posteriorly and
proximally (series 7, images 11-13). Minimal retraction. Mild
infraspinatus tendinosis with small focal low-grade
partial-thickness articular surface tear of the distal tendon
(series 8, image 18). The subscapularis and teres minor tendons are
intact.

Muscles: No atrophy or abnormal signal of the muscles of the rotator
cuff.

Biceps long head: Severe intra-articular tendinosis. Longitudinal
split tear within the bicipital groove (series 6, image 18).
Normally located.

Acromioclavicular Joint: Severe arthropathy of the acromioclavicular
joint. Type I acromion. Trace subacromial/subdeltoid bursal fluid.

Glenohumeral Joint: No joint effusion. Mild partial-thickness
cartilage loss over the medial humeral head.

Labrum: Grossly intact, but evaluation is limited by lack of
intraarticular fluid.

Bones: No acute fracture or dislocation. No suspicious bone lesion.

Other: None.
IMPRESSION: 1. Mild supraspinatus tendinosis with high-grade partial-thickness
bursal surface tear anteriorly at the insertion and small focal
full-thickness component more posteriorly and proximally.
2. Mild infraspinatus tendinosis with small focal low-grade
partial-thickness articular surface tear of the distal tendon.
3. Severe intra-articular tendinosis of the long head biceps tendon
with longitudinal split tear within the bicipital groove.
4. Severe acromioclavicular and mild glenohumeral osteoarthritis.

## 2023-02-07 DIAGNOSIS — R531 Weakness: Secondary | ICD-10-CM | POA: Diagnosis not present

## 2023-02-07 DIAGNOSIS — M542 Cervicalgia: Secondary | ICD-10-CM | POA: Diagnosis not present

## 2023-02-07 DIAGNOSIS — M25552 Pain in left hip: Secondary | ICD-10-CM | POA: Diagnosis not present

## 2023-02-10 DIAGNOSIS — R531 Weakness: Secondary | ICD-10-CM | POA: Diagnosis not present

## 2023-02-10 DIAGNOSIS — M542 Cervicalgia: Secondary | ICD-10-CM | POA: Diagnosis not present

## 2023-02-10 DIAGNOSIS — M25552 Pain in left hip: Secondary | ICD-10-CM | POA: Diagnosis not present

## 2023-02-13 DIAGNOSIS — E1122 Type 2 diabetes mellitus with diabetic chronic kidney disease: Secondary | ICD-10-CM | POA: Diagnosis not present

## 2023-02-13 DIAGNOSIS — E669 Obesity, unspecified: Secondary | ICD-10-CM | POA: Diagnosis not present

## 2023-02-15 DIAGNOSIS — R531 Weakness: Secondary | ICD-10-CM | POA: Diagnosis not present

## 2023-02-15 DIAGNOSIS — M5432 Sciatica, left side: Secondary | ICD-10-CM | POA: Diagnosis not present

## 2023-02-15 DIAGNOSIS — M25552 Pain in left hip: Secondary | ICD-10-CM | POA: Diagnosis not present

## 2023-02-17 DIAGNOSIS — M5432 Sciatica, left side: Secondary | ICD-10-CM | POA: Diagnosis not present

## 2023-02-17 DIAGNOSIS — R531 Weakness: Secondary | ICD-10-CM | POA: Diagnosis not present

## 2023-02-17 DIAGNOSIS — M25552 Pain in left hip: Secondary | ICD-10-CM | POA: Diagnosis not present

## 2023-02-21 DIAGNOSIS — M5432 Sciatica, left side: Secondary | ICD-10-CM | POA: Diagnosis not present

## 2023-02-21 DIAGNOSIS — R531 Weakness: Secondary | ICD-10-CM | POA: Diagnosis not present

## 2023-02-21 DIAGNOSIS — M25552 Pain in left hip: Secondary | ICD-10-CM | POA: Diagnosis not present

## 2023-02-23 DIAGNOSIS — M25552 Pain in left hip: Secondary | ICD-10-CM | POA: Diagnosis not present

## 2023-02-23 DIAGNOSIS — R531 Weakness: Secondary | ICD-10-CM | POA: Diagnosis not present

## 2023-02-23 DIAGNOSIS — M5432 Sciatica, left side: Secondary | ICD-10-CM | POA: Diagnosis not present

## 2023-02-24 DIAGNOSIS — J309 Allergic rhinitis, unspecified: Secondary | ICD-10-CM | POA: Diagnosis not present

## 2023-02-24 DIAGNOSIS — M810 Age-related osteoporosis without current pathological fracture: Secondary | ICD-10-CM | POA: Diagnosis not present

## 2023-02-24 DIAGNOSIS — I471 Supraventricular tachycardia, unspecified: Secondary | ICD-10-CM | POA: Diagnosis not present

## 2023-02-24 DIAGNOSIS — E785 Hyperlipidemia, unspecified: Secondary | ICD-10-CM | POA: Diagnosis not present

## 2023-02-24 DIAGNOSIS — I951 Orthostatic hypotension: Secondary | ICD-10-CM | POA: Diagnosis not present

## 2023-02-24 DIAGNOSIS — M1990 Unspecified osteoarthritis, unspecified site: Secondary | ICD-10-CM | POA: Diagnosis not present

## 2023-02-24 DIAGNOSIS — I252 Old myocardial infarction: Secondary | ICD-10-CM | POA: Diagnosis not present

## 2023-02-24 DIAGNOSIS — I129 Hypertensive chronic kidney disease with stage 1 through stage 4 chronic kidney disease, or unspecified chronic kidney disease: Secondary | ICD-10-CM | POA: Diagnosis not present

## 2023-02-24 DIAGNOSIS — E039 Hypothyroidism, unspecified: Secondary | ICD-10-CM | POA: Diagnosis not present

## 2023-03-02 DIAGNOSIS — R531 Weakness: Secondary | ICD-10-CM | POA: Diagnosis not present

## 2023-03-02 DIAGNOSIS — M5432 Sciatica, left side: Secondary | ICD-10-CM | POA: Diagnosis not present

## 2023-03-02 DIAGNOSIS — M25552 Pain in left hip: Secondary | ICD-10-CM | POA: Diagnosis not present

## 2023-03-04 DIAGNOSIS — M25552 Pain in left hip: Secondary | ICD-10-CM | POA: Diagnosis not present

## 2023-03-04 DIAGNOSIS — M5432 Sciatica, left side: Secondary | ICD-10-CM | POA: Diagnosis not present

## 2023-03-04 DIAGNOSIS — R531 Weakness: Secondary | ICD-10-CM | POA: Diagnosis not present

## 2023-03-07 DIAGNOSIS — M25552 Pain in left hip: Secondary | ICD-10-CM | POA: Diagnosis not present

## 2023-03-07 DIAGNOSIS — M5432 Sciatica, left side: Secondary | ICD-10-CM | POA: Diagnosis not present

## 2023-03-07 DIAGNOSIS — R531 Weakness: Secondary | ICD-10-CM | POA: Diagnosis not present

## 2023-03-15 DIAGNOSIS — E669 Obesity, unspecified: Secondary | ICD-10-CM | POA: Diagnosis not present

## 2023-03-15 DIAGNOSIS — E1122 Type 2 diabetes mellitus with diabetic chronic kidney disease: Secondary | ICD-10-CM | POA: Diagnosis not present

## 2023-03-16 DIAGNOSIS — M5432 Sciatica, left side: Secondary | ICD-10-CM | POA: Diagnosis not present

## 2023-03-16 DIAGNOSIS — M25552 Pain in left hip: Secondary | ICD-10-CM | POA: Diagnosis not present

## 2023-03-16 DIAGNOSIS — R531 Weakness: Secondary | ICD-10-CM | POA: Diagnosis not present

## 2023-04-13 NOTE — Progress Notes (Unsigned)
Cardiology Office Note:  .   Date:  04/14/2023  ID:  Ann Lester, DOB [birth year], MRN [id number] PCP: Abner Greenspan, MD  [facility] Providers Cardiologist:  None    History of Present Illness: .   Ann Lester is a 77 y.o. female with a past medical history of CAD, factor V Leiden mutation, DM2, hypertension, history of pulmonary embolism, dyslipidemia, palpitations.  07/22/2022 coronary CTA calcium score 471 placing her in the 85th percentile 03/02/2022 monitor Average heart rate of 76 bpm, predominant rhythm was sinus, 1 run of VT occurred lasting 7 beats, 20 episodes of SVT occurred 07/11/2021 echo EF 60 to 65%, impaired relaxation, no RWMA, RA mildly enlarged, trace to mild AR, trace to mild MR, mild TR 07/11/2021 MPI negative for ischemia  Most recently evaluated by Dr. Dulce Sellar on 09/16/2022, was doing well with her angina on Ranexa and amlodipine, she was advised she could follow-up in 6 months.  She presents today for follow-up of her CAD, she offers no formal complaints from a cardiac perspective.  She has had a relatively high level of stress with some issues surrounding her husband, has not been able to be as active as she would like to be, has plans to start working out in the future.  We discussed her dyslipidemia, need for better control however she then speaks up and states that possibly her endocrinologist told her she had elevated liver function testing and some medications had been decreased due to this. She denies chest pain, palpitations, dyspnea, pnd, orthopnea, n, v, dizziness, syncope, edema, weight gain, or early satiety.   ROS: Review of Systems  All other systems reviewed and are negative.    Studies Reviewed: .        Cardiac Studies & Procedures     STRESS TESTS  MYOCARDIAL PERFUSION IMAGING 07/11/2021     MONITORS  LONG TERM MONITOR (3-14 DAYS) 03/02/2022   CT SCANS  CT CORONARY MORPH W/CTA COR W/SCORE 07/21/2022  Addendum 07/22/2022  9:10  PM ADDENDUM REPORT: 07/22/2022 21:07  EXAM: OVER-READ INTERPRETATION  CT CHEST  The following report is an over-read performed by radiologist Dr. Noe Gens [facility] on 07/22/2022. This over-read does not include interpretation of cardiac or coronary anatomy or pathology. The coronary CTA interpretation by the cardiologist is attached.  COMPARISON:  02/08/2022  FINDINGS: Heart is normal size. Aorta normal caliber. No adenopathy. No confluent airspace opacities or effusions. No acute findings in the upper abdomen. Cyst within the left hepatic lobe is stable since prior study. Chest wall soft tissues are unremarkable. No acute bony abnormality.  IMPRESSION: No acute or significant extracardiac abnormality.   Electronically Signed By: Charlett Nose M.D. On: 07/22/2022 21:07  Narrative CLINICAL DATA:  Chest pain  EXAM: Cardiac CTA  MEDICATIONS: Sub lingual nitro. 4mg  and lopressor 100mg   TECHNIQUE: The patient was scanned on a Siemens Force 192 slice scanner. Gantry rotation speed was 250 msecs. Collimation was .6 mm. A 120 kV prospective scan was triggered in the ascending thoracic aorta at 140 HU's Full mA was used between 35% and 75% of the R-R interval. Average HR during the scan was 58 bpm. The 3D data set was interpreted on a dedicated work station using MPR, MIP and VRT modes. A total of 80cc of contrast was used.  FINDINGS: Non-cardiac: See separate report from [facility]. No significant findings on limited lung and soft tissue windows.  Calcium Score: LM and 3 vessel calcium  LM:  30.5  LAD 151  LCX 29.9  RCA 260  Total: 471  Coronary Arteries: Right dominant with no anomalies  LM: 1-24% ostial calcified plaque  LAD: 25-49% calcified plaque proximally 1-24% calcified plaque mid vessel  D1: Normal  D2: Normal  Circumflex: 1-24% calcified plaque ostium  OM1: Normal  AV groove: Normal  RCA: 1-24% calcified  plaque proximal and mid vessel 25-49% calcified plaque distally  PDA: Normal  PLA: 1-24% calcified plaque  IMPRESSION: 1.  CAD RADS 2 no obstructive CAD see description above  2.  Normal ascending thoracic aorta 3.1 cm  3.  Calcium score 471 which is 85 th percentile for age/sex  Charlton Haws  Electronically Signed: By: Charlton Haws M.D. On: 07/21/2022 13:26          Risk Assessment/Calculations:             Physical Exam:   VS:  BP 123/71 (BP Location: Left Arm, Patient Position: Sitting, Cuff Size: Normal)   Pulse 67   Ht 5' 5.5" (1.664 m)   Wt 155 lb (70.3 kg)   SpO2 94%   BMI 25.40 kg/m    Wt Readings from Last 3 Encounters:  04/14/23 155 lb (70.3 kg)  09/16/22 150 lb (68 kg)  08/18/22 153 lb (69.4 kg)    GEN: Well nourished, well developed in no acute distress NECK: No JVD; No carotid bruits CARDIAC: RRR, no murmurs, rubs, gallops RESPIRATORY:  Clear to auscultation without rales, wheezing or rhonchi  ABDOMEN: Soft, non-tender, non-distended EXTREMITIES:  No edema; No deformity   ASSESSMENT AND PLAN: .   CAD-nonobstructive per coronary CTA in 2024, Stable with no anginal symptoms. No indication for ischemic evaluation.  Continue Norvasc 5 mg daily, continue nitroglycerin as needed, continue Ranexa 1000 mg twice daily, continue Crestor 20 mg twice weekly, she is not on aspirin because she is on Xarelto. Heart healthy diet and regular cardiovascular exercise encouraged.   Dyslipidemia-most recent LDL was elevated at 98, she takes Crestor 20 mg twice weekly, we were discussing increasing this for better control of her lipids however she then states that she recalls possibly her endocrinologist telling her that she had elevated LFTs.  I do not see any recent blood work that suggest this, she states she plans to see her PCP in the near future and she will likely check blood work, will defer adjusting her Crestor at this time. Palpitations-quiescent, continue  metoprolol 25 mg twice daily. Factor V Leyden mutation-currently on Xarelto 20 mg daily. DM2-she states she was diagnosed with prediabetes in the past however was able to make lifestyle modifications, not currently on any antihyperglycemic agents       Dispo: 78-month follow-up with Dr. Dulce Sellar.  Signed, Flossie Dibble, NP

## 2023-04-14 ENCOUNTER — Encounter: Payer: Self-pay | Admitting: Cardiology

## 2023-04-14 ENCOUNTER — Ambulatory Visit: Payer: Medicare PPO | Attending: Cardiology | Admitting: Cardiology

## 2023-04-14 VITALS — BP 123/71 | HR 67 | Ht 65.5 in | Wt 155.0 lb

## 2023-04-14 DIAGNOSIS — I10 Essential (primary) hypertension: Secondary | ICD-10-CM | POA: Diagnosis not present

## 2023-04-14 DIAGNOSIS — R002 Palpitations: Secondary | ICD-10-CM | POA: Diagnosis not present

## 2023-04-14 DIAGNOSIS — Z7901 Long term (current) use of anticoagulants: Secondary | ICD-10-CM | POA: Diagnosis not present

## 2023-04-14 DIAGNOSIS — E785 Hyperlipidemia, unspecified: Secondary | ICD-10-CM

## 2023-04-14 DIAGNOSIS — I2510 Atherosclerotic heart disease of native coronary artery without angina pectoris: Secondary | ICD-10-CM

## 2023-04-14 DIAGNOSIS — D6851 Activated protein C resistance: Secondary | ICD-10-CM

## 2023-04-14 DIAGNOSIS — D6859 Other primary thrombophilia: Secondary | ICD-10-CM

## 2023-04-14 NOTE — Patient Instructions (Signed)
Medication Instructions:  Your physician recommends that you continue on your current medications as directed. Please refer to the Current Medication list given to you today.  *If you need a refill on your cardiac medications before your next appointment, please call your pharmacy*   Lab Work: NONE If you have labs (blood work) drawn today and your tests are completely normal, you will receive your results only by: MyChart Message (if you have MyChart) OR A paper copy in the mail If you have any lab test that is abnormal or we need to change your treatment, we will call you to review the results.   Testing/Procedures: NONE   Follow-Up: At [facility], you and your health needs are our priority.  As part of our continuing mission to provide you with exceptional heart care, we have created designated Provider Care Teams.  These Care Teams include your primary Cardiologist (physician) and Advanced Practice Providers (APPs -  Physician Assistants and Nurse Practitioners) who all work together to provide you with the care you need, when you need it.  We recommend signing up for the patient portal called "MyChart".  Sign up information is provided on this After Visit Summary.  MyChart is used to connect with patients for Virtual Visits (Telemedicine).  Patients are able to view lab/test results, encounter notes, upcoming appointments, etc.  Non-urgent messages can be sent to your provider as well.   To learn more about what you can do with MyChart, go to [email or web address].    Your next appointment:   6 month(s)  Provider:   Brian Munley, MD    Other Instructions   

## 2023-04-15 DIAGNOSIS — E1122 Type 2 diabetes mellitus with diabetic chronic kidney disease: Secondary | ICD-10-CM | POA: Diagnosis not present

## 2023-04-15 DIAGNOSIS — E669 Obesity, unspecified: Secondary | ICD-10-CM | POA: Diagnosis not present

## 2023-05-03 DIAGNOSIS — M7989 Other specified soft tissue disorders: Secondary | ICD-10-CM | POA: Diagnosis not present

## 2023-05-03 DIAGNOSIS — M1990 Unspecified osteoarthritis, unspecified site: Secondary | ICD-10-CM | POA: Diagnosis not present

## 2023-05-03 DIAGNOSIS — M810 Age-related osteoporosis without current pathological fracture: Secondary | ICD-10-CM | POA: Diagnosis not present

## 2023-05-03 DIAGNOSIS — L409 Psoriasis, unspecified: Secondary | ICD-10-CM | POA: Diagnosis not present

## 2023-05-03 DIAGNOSIS — L4050 Arthropathic psoriasis, unspecified: Secondary | ICD-10-CM | POA: Diagnosis not present

## 2023-05-03 DIAGNOSIS — M549 Dorsalgia, unspecified: Secondary | ICD-10-CM | POA: Diagnosis not present

## 2023-05-03 DIAGNOSIS — R748 Abnormal levels of other serum enzymes: Secondary | ICD-10-CM | POA: Diagnosis not present

## 2023-05-03 DIAGNOSIS — Z79899 Other long term (current) drug therapy: Secondary | ICD-10-CM | POA: Diagnosis not present

## 2023-05-03 DIAGNOSIS — M79676 Pain in unspecified toe(s): Secondary | ICD-10-CM | POA: Diagnosis not present

## 2023-05-09 DIAGNOSIS — E1169 Type 2 diabetes mellitus with other specified complication: Secondary | ICD-10-CM | POA: Diagnosis not present

## 2023-05-09 DIAGNOSIS — E1122 Type 2 diabetes mellitus with diabetic chronic kidney disease: Secondary | ICD-10-CM | POA: Diagnosis not present

## 2023-05-09 DIAGNOSIS — E039 Hypothyroidism, unspecified: Secondary | ICD-10-CM | POA: Diagnosis not present

## 2023-05-15 DIAGNOSIS — E669 Obesity, unspecified: Secondary | ICD-10-CM | POA: Diagnosis not present

## 2023-05-15 DIAGNOSIS — E1122 Type 2 diabetes mellitus with diabetic chronic kidney disease: Secondary | ICD-10-CM | POA: Diagnosis not present

## 2023-05-16 DIAGNOSIS — E1122 Type 2 diabetes mellitus with diabetic chronic kidney disease: Secondary | ICD-10-CM | POA: Diagnosis not present

## 2023-05-16 DIAGNOSIS — Z6825 Body mass index (BMI) 25.0-25.9, adult: Secondary | ICD-10-CM | POA: Diagnosis not present

## 2023-05-16 DIAGNOSIS — I129 Hypertensive chronic kidney disease with stage 1 through stage 4 chronic kidney disease, or unspecified chronic kidney disease: Secondary | ICD-10-CM | POA: Diagnosis not present

## 2023-05-16 DIAGNOSIS — E039 Hypothyroidism, unspecified: Secondary | ICD-10-CM | POA: Diagnosis not present

## 2023-05-16 DIAGNOSIS — N182 Chronic kidney disease, stage 2 (mild): Secondary | ICD-10-CM | POA: Diagnosis not present

## 2023-05-24 DIAGNOSIS — H6123 Impacted cerumen, bilateral: Secondary | ICD-10-CM | POA: Diagnosis not present

## 2023-06-15 DIAGNOSIS — E669 Obesity, unspecified: Secondary | ICD-10-CM | POA: Diagnosis not present

## 2023-06-15 DIAGNOSIS — E1122 Type 2 diabetes mellitus with diabetic chronic kidney disease: Secondary | ICD-10-CM | POA: Diagnosis not present

## 2023-06-20 DIAGNOSIS — E039 Hypothyroidism, unspecified: Secondary | ICD-10-CM | POA: Diagnosis not present

## 2023-06-20 DIAGNOSIS — R002 Palpitations: Secondary | ICD-10-CM | POA: Diagnosis not present

## 2023-06-20 DIAGNOSIS — Z6825 Body mass index (BMI) 25.0-25.9, adult: Secondary | ICD-10-CM | POA: Diagnosis not present

## 2023-07-12 ENCOUNTER — Other Ambulatory Visit: Payer: Self-pay | Admitting: Cardiology

## 2023-07-16 DIAGNOSIS — E1122 Type 2 diabetes mellitus with diabetic chronic kidney disease: Secondary | ICD-10-CM | POA: Diagnosis not present

## 2023-07-16 DIAGNOSIS — E669 Obesity, unspecified: Secondary | ICD-10-CM | POA: Diagnosis not present

## 2023-07-20 ENCOUNTER — Other Ambulatory Visit: Payer: Self-pay | Admitting: Cardiology

## 2023-08-01 NOTE — Progress Notes (Deleted)
 Cardiology Office Note:  .   Date:  08/01/2023  ID:  [name], DOB 08-Jan-1946, MRN [id number] PCP: Abner Greenspan, MD  [facility] Providers Cardiologist:  None    History of Present Illness: .   REBECKAH MASIH is a 78 y.o. female with a past medical history of CAD, factor V Leiden mutation, DM2, hypertension, history of pulmonary embolism, dyslipidemia, palpitations.  07/22/2022 coronary CTA calcium score 471 placing her in the 85th percentile 03/02/2022 monitor Average heart rate of 76 bpm, predominant rhythm was sinus, 1 run of VT occurred lasting 7 beats, 20 episodes of SVT occurred 07/11/2021 echo EF 60 to 65%, impaired relaxation, no RWMA, RA mildly enlarged, trace to mild AR, trace to mild MR, mild TR 07/11/2021 MPI negative for ischemia  Most recently evaluated by Dr. Dulce Sellar on 09/16/2022, was doing well with her angina on Ranexa and amlodipine, she was advised she could follow-up in 6 months.  Evaluate 04/14/2023 for for CAD for follow-up of her CAD, she offers no formal complaints from a cardiac perspective.  She has had a relatively high level of stress with some issues surrounding her husband, has not been able to be as active as she would like to be, has plans to start working out in the future.  We discussed her dyslipidemia, need for better control however she then speaks up and states that possibly her endocrinologist told her she had elevated liver function testing and some medications had been decreased due to this. She denies chest pain, palpitations, dyspnea, pnd, orthopnea, n, v, dizziness, syncope, edema, weight gain, or early satiety.   6 mos follow up   ROS: Review of Systems  All other systems reviewed and are negative.    Studies Reviewed: .        Cardiac Studies & Procedures   ______________________________________________________________________________________________   STRESS TESTS  MYOCARDIAL PERFUSION IMAGING 07/11/2021       MONITORS  LONG TERM MONITOR (3-14 DAYS) 03/02/2022   CT SCANS  CT CORONARY MORPH W/CTA COR W/SCORE 07/21/2022  Addendum 07/22/2022  9:10 PM ADDENDUM REPORT: 07/22/2022 21:07  EXAM: OVER-READ INTERPRETATION  CT CHEST  The following report is an over-read performed by radiologist Dr. Noe Gens [facility] on 07/22/2022. This over-read does not include interpretation of cardiac or coronary anatomy or pathology. The coronary CTA interpretation by the cardiologist is attached.  COMPARISON:  02/08/2022  FINDINGS: Heart is normal size. Aorta normal caliber. No adenopathy. No confluent airspace opacities or effusions. No acute findings in the upper abdomen. Cyst within the left hepatic lobe is stable since prior study. Chest wall soft tissues are unremarkable. No acute bony abnormality.  IMPRESSION: No acute or significant extracardiac abnormality.   Electronically Signed By: Charlett Nose M.D. On: 07/22/2022 21:07  Narrative CLINICAL DATA:  Chest pain  EXAM: Cardiac CTA  MEDICATIONS: Sub lingual nitro. 4mg  and lopressor 100mg   TECHNIQUE: The patient was scanned on a Siemens Force 192 slice scanner. Gantry rotation speed was 250 msecs. Collimation was .6 mm. A 120 kV prospective scan was triggered in the ascending thoracic aorta at 140 HU's Full mA was used between 35% and 75% of the R-R interval. Average HR during the scan was 58 bpm. The 3D data set was interpreted on a dedicated work station using MPR, MIP and VRT modes. A total of 80cc of contrast was used.  FINDINGS: Non-cardiac: See separate report from [facility]. No significant findings on limited lung and soft tissue windows.  Calcium Score: LM and 3 vessel calcium  LM: 30.5  LAD 151  LCX 29.9  RCA 260  Total: 471  Coronary Arteries: Right dominant with no anomalies  LM: 1-24% ostial calcified plaque  LAD: 25-49% calcified plaque proximally 1-24% calcified plaque  mid vessel  D1: Normal  D2: Normal  Circumflex: 1-24% calcified plaque ostium  OM1: Normal  AV groove: Normal  RCA: 1-24% calcified plaque proximal and mid vessel 25-49% calcified plaque distally  PDA: Normal  PLA: 1-24% calcified plaque  IMPRESSION: 1.  CAD RADS 2 no obstructive CAD see description above  2.  Normal ascending thoracic aorta 3.1 cm  3.  Calcium score 471 which is 85 th percentile for age/sex  Charlton Haws  Electronically Signed: By: Charlton Haws M.D. On: 07/21/2022 13:26     ______________________________________________________________________________________________      Risk Assessment/Calculations:     No BP recorded.  {Refresh Note OR Click here to enter BP  :1}***       Physical Exam:   VS:  There were no vitals taken for this visit.   Wt Readings from Last 3 Encounters:  04/14/23 155 lb (70.3 kg)  09/16/22 150 lb (68 kg)  08/18/22 153 lb (69.4 kg)    GEN: Well nourished, well developed in no acute distress NECK: No JVD; No carotid bruits CARDIAC: RRR, no murmurs, rubs, gallops RESPIRATORY:  Clear to auscultation without rales, wheezing or rhonchi  ABDOMEN: Soft, non-tender, non-distended EXTREMITIES:  No edema; No deformity   ASSESSMENT AND PLAN: .   CAD-nonobstructive per coronary CTA in 2024, Stable with no anginal symptoms. No indication for ischemic evaluation.  Continue Norvasc 5 mg daily, continue nitroglycerin as needed, continue Ranexa 1000 mg twice daily, continue Crestor 20 mg twice weekly, she is not on aspirin because she is on Xarelto. Heart healthy diet and regular cardiovascular exercise encouraged.   Dyslipidemia-most recent LDL was elevated at 98, she takes Crestor 20 mg twice weekly, we were discussing increasing this for better control of her lipids however she then states that she recalls possibly her endocrinologist telling her that she had elevated LFTs.  I do not see any recent blood work that suggest this, she  states she plans to see her PCP in the near future and she will likely check blood work, will defer adjusting her Crestor at this time. Palpitations-quiescent, continue metoprolol 25 mg twice daily. Factor V Leyden mutation-currently on Xarelto 20 mg daily. DM2-she states she was diagnosed with prediabetes in the past however was able to make lifestyle modifications, not currently on any antihyperglycemic agents       Dispo: 60-month follow-up with Dr. Dulce Sellar.  Signed, Flossie Dibble, NP

## 2023-08-02 ENCOUNTER — Ambulatory Visit: Payer: Medicare PPO | Admitting: Cardiology

## 2023-08-02 DIAGNOSIS — I10 Essential (primary) hypertension: Secondary | ICD-10-CM

## 2023-08-02 DIAGNOSIS — E785 Hyperlipidemia, unspecified: Secondary | ICD-10-CM

## 2023-08-02 DIAGNOSIS — I2510 Atherosclerotic heart disease of native coronary artery without angina pectoris: Secondary | ICD-10-CM

## 2023-08-02 DIAGNOSIS — R002 Palpitations: Secondary | ICD-10-CM

## 2023-08-10 NOTE — Progress Notes (Unsigned)
 Cardiology Office Note:  .   Date:  08/11/2023  ID:  [name], DOB 1946-02-21, MRN [id number] PCP: Abner Greenspan, MD  [facility] Providers Cardiologist:  Norman Herrlich, MD    History of Present Illness: .   Ann Lester is a 78 y.o. female with a past medical history of CAD  s/p MI in 2007, factor V Leiden mutation, DM2, hypertension, history of pulmonary embolism on Xarelto, dyslipidemia, palpitations.  07/22/2022 coronary CTA calcium score 471 placing her in the 85th percentile 03/02/2022 monitor Average heart rate of 76 bpm, predominant rhythm was sinus, 1 run of VT occurred lasting 7 beats, 20 episodes of SVT occurred 07/11/2021 echo EF 60 to 65%, impaired relaxation, no RWMA, RA mildly enlarged, trace to mild AR, trace to mild MR, mild TR 07/11/2021 MPI negative for ischemia 2007 s/p MI -- no intervention  Evaluated by Dr. Dulce Sellar on 09/16/2022, was doing well with her angina on Ranexa and amlodipine, she was advised she could follow-up in 6 months.  Evaluate 04/14/2023 for follow-up of her CAD, she was stable and we advise she can follow-up in 6 months.  She presents today accompanied by her husband for follow-up of her CAD.  She has been doing well since she was last evaluated in our office.  She offers no formal complaints.  She does not participate in any formal exercise but she is very active around her home. She denies chest pain, palpitations, dyspnea, pnd, orthopnea, n, v, dizziness, syncope, edema, weight gain, or early satiety.    ROS: Review of Systems  All other systems reviewed and are negative.    Studies Reviewed: .        Cardiac Studies & Procedures   ______________________________________________________________________________________________   STRESS TESTS  MYOCARDIAL PERFUSION IMAGING 07/11/2021      MONITORS  LONG TERM MONITOR (3-14 DAYS) 03/02/2022   CT SCANS  CT CORONARY MORPH W/CTA COR W/SCORE 07/21/2022  Addendum 07/22/2022  9:10  PM ADDENDUM REPORT: 07/22/2022 21:07  EXAM: OVER-READ INTERPRETATION  CT CHEST  The following report is an over-read performed by radiologist Dr. Noe Gens [facility] on 07/22/2022. This over-read does not include interpretation of cardiac or coronary anatomy or pathology. The coronary CTA interpretation by the cardiologist is attached.  COMPARISON:  02/08/2022  FINDINGS: Heart is normal size. Aorta normal caliber. No adenopathy. No confluent airspace opacities or effusions. No acute findings in the upper abdomen. Cyst within the left hepatic lobe is stable since prior study. Chest wall soft tissues are unremarkable. No acute bony abnormality.  IMPRESSION: No acute or significant extracardiac abnormality.   Electronically Signed By: Charlett Nose M.D. On: 07/22/2022 21:07  Narrative CLINICAL DATA:  Chest pain  EXAM: Cardiac CTA  MEDICATIONS: Sub lingual nitro. 4mg  and lopressor 100mg   TECHNIQUE: The patient was scanned on a Siemens Force 192 slice scanner. Gantry rotation speed was 250 msecs. Collimation was .6 mm. A 120 kV prospective scan was triggered in the ascending thoracic aorta at 140 HU's Full mA was used between 35% and 75% of the R-R interval. Average HR during the scan was 58 bpm. The 3D data set was interpreted on a dedicated work station using MPR, MIP and VRT modes. A total of 80cc of contrast was used.  FINDINGS: Non-cardiac: See separate report from [facility]. No significant findings on limited lung and soft tissue windows.  Calcium Score: LM and 3 vessel calcium  LM: 30.5  LAD 151  LCX 29.9  RCA 260  Total: 471  Coronary Arteries: Right dominant with no anomalies  LM: 1-24% ostial calcified plaque  LAD: 25-49% calcified plaque proximally 1-24% calcified plaque mid vessel  D1: Normal  D2: Normal  Circumflex: 1-24% calcified plaque ostium  OM1: Normal  AV groove: Normal  RCA: 1-24% calcified  plaque proximal and mid vessel 25-49% calcified plaque distally  PDA: Normal  PLA: 1-24% calcified plaque  IMPRESSION: 1.  CAD RADS 2 no obstructive CAD see description above  2.  Normal ascending thoracic aorta 3.1 cm  3.  Calcium score 471 which is 85 th percentile for age/sex  Charlton Haws  Electronically Signed: By: Charlton Haws M.D. On: 07/21/2022 13:26     ______________________________________________________________________________________________      Risk Assessment/Calculations:             Physical Exam:   VS:  BP 120/70 (BP Location: Left Arm, Patient Position: Sitting, Cuff Size: Normal)   Pulse 70   Ht 5' 5.5" (1.664 m)   Wt 159 lb (72.1 kg)   SpO2 96%   BMI 26.06 kg/m    Wt Readings from Last 3 Encounters:  08/11/23 159 lb (72.1 kg)  04/14/23 155 lb (70.3 kg)  09/16/22 150 lb (68 kg)    GEN: Well nourished, well developed in no acute distress NECK: No JVD; No carotid bruits CARDIAC: RRR, no murmurs, rubs, gallops RESPIRATORY:  Clear to auscultation without rales, wheezing or rhonchi  ABDOMEN: Soft, non-tender, non-distended EXTREMITIES:  No edema; No deformity   ASSESSMENT AND PLAN: .   CAD-nonobstructive per coronary CTA in 2024, Stable with no anginal symptoms. No indication for ischemic evaluation.  Continue Norvasc 5 mg daily, continue nitroglycerin as needed, continue Ranexa 1000 mg twice daily, continue Crestor 20 mg twice weekly, she is not on aspirin because she is on Xarelto. Heart healthy diet and regular cardiovascular exercise encouraged.    Dyslipidemia-most recent LDL is controlled at 73 on 05/09/2023 she takes Crestor 20 mg twice weekly.  Palpitations-quiescent, continue metoprolol 25 mg twice daily.  Factor V Leyden mutation-currently on Xarelto 20 mg daily.  DM2-she states she was diagnosed with prediabetes in the past however was able to make lifestyle modifications, not currently on any antihyperglycemic agents.        Dispo: 31-month follow-up with Dr. Dulce Sellar.  Signed, Flossie Dibble, NP

## 2023-08-11 ENCOUNTER — Ambulatory Visit: Payer: Medicare PPO | Attending: Cardiology | Admitting: Cardiology

## 2023-08-11 ENCOUNTER — Encounter: Payer: Self-pay | Admitting: Cardiology

## 2023-08-11 VITALS — BP 120/70 | HR 70 | Ht 65.5 in | Wt 159.0 lb

## 2023-08-11 DIAGNOSIS — I10 Essential (primary) hypertension: Secondary | ICD-10-CM | POA: Diagnosis not present

## 2023-08-11 DIAGNOSIS — E785 Hyperlipidemia, unspecified: Secondary | ICD-10-CM

## 2023-08-11 DIAGNOSIS — D6851 Activated protein C resistance: Secondary | ICD-10-CM

## 2023-08-11 DIAGNOSIS — Z7901 Long term (current) use of anticoagulants: Secondary | ICD-10-CM

## 2023-08-11 DIAGNOSIS — I2510 Atherosclerotic heart disease of native coronary artery without angina pectoris: Secondary | ICD-10-CM

## 2023-08-11 DIAGNOSIS — R002 Palpitations: Secondary | ICD-10-CM

## 2023-08-11 NOTE — Patient Instructions (Signed)
 Medication Instructions:   *If you need a refill on your cardiac medications before your next appointment, please call your pharmacy*   Lab Work:  If you have labs (blood work) drawn today and your tests are completely normal, you will receive your results only by: MyChart Message (if you have MyChart) OR A paper copy in the mail If you have any lab test that is abnormal or we need to change your treatment, we will call you to review the results.   Testing/Procedures:    Follow-Up: At [facility], you and your health needs are our priority.  As part of our continuing mission to provide you with exceptional heart care, we have created designated Provider Care Teams.  These Care Teams include your primary Cardiologist (physician) and Advanced Practice Providers (APPs -  Physician Assistants and Nurse Practitioners) who all work together to provide you with the care you need, when you need it.  We recommend signing up for the patient portal called "MyChart".  Sign up information is provided on this After Visit Summary.  MyChart is used to connect with patients for Virtual Visits (Telemedicine).  Patients are able to view lab/test results, encounter notes, upcoming appointments, etc.  Non-urgent messages can be sent to your provider as well.   To learn more about what you can do with MyChart, go to [email or web address].    Your next appointment:   9 month(s)  Provider:   Norman Herrlich, MD    Other Instructions  Have a great summer!!

## 2023-08-12 DIAGNOSIS — M810 Age-related osteoporosis without current pathological fracture: Secondary | ICD-10-CM | POA: Diagnosis not present

## 2023-08-13 DIAGNOSIS — E669 Obesity, unspecified: Secondary | ICD-10-CM | POA: Diagnosis not present

## 2023-08-13 DIAGNOSIS — E1122 Type 2 diabetes mellitus with diabetic chronic kidney disease: Secondary | ICD-10-CM | POA: Diagnosis not present

## 2023-08-14 ENCOUNTER — Other Ambulatory Visit: Payer: Self-pay | Admitting: Cardiology

## 2023-08-30 DIAGNOSIS — Z79899 Other long term (current) drug therapy: Secondary | ICD-10-CM | POA: Diagnosis not present

## 2023-08-30 DIAGNOSIS — M7989 Other specified soft tissue disorders: Secondary | ICD-10-CM | POA: Diagnosis not present

## 2023-08-30 DIAGNOSIS — L409 Psoriasis, unspecified: Secondary | ICD-10-CM | POA: Diagnosis not present

## 2023-08-30 DIAGNOSIS — M810 Age-related osteoporosis without current pathological fracture: Secondary | ICD-10-CM | POA: Diagnosis not present

## 2023-08-30 DIAGNOSIS — M79676 Pain in unspecified toe(s): Secondary | ICD-10-CM | POA: Diagnosis not present

## 2023-08-30 DIAGNOSIS — R748 Abnormal levels of other serum enzymes: Secondary | ICD-10-CM | POA: Diagnosis not present

## 2023-08-30 DIAGNOSIS — M549 Dorsalgia, unspecified: Secondary | ICD-10-CM | POA: Diagnosis not present

## 2023-08-30 DIAGNOSIS — M1990 Unspecified osteoarthritis, unspecified site: Secondary | ICD-10-CM | POA: Diagnosis not present

## 2023-08-30 DIAGNOSIS — L4050 Arthropathic psoriasis, unspecified: Secondary | ICD-10-CM | POA: Diagnosis not present

## 2023-09-08 DIAGNOSIS — L4050 Arthropathic psoriasis, unspecified: Secondary | ICD-10-CM | POA: Diagnosis not present

## 2023-09-16 DIAGNOSIS — E119 Type 2 diabetes mellitus without complications: Secondary | ICD-10-CM | POA: Diagnosis not present

## 2023-10-03 DIAGNOSIS — E039 Hypothyroidism, unspecified: Secondary | ICD-10-CM | POA: Diagnosis not present

## 2023-10-03 DIAGNOSIS — E1169 Type 2 diabetes mellitus with other specified complication: Secondary | ICD-10-CM | POA: Diagnosis not present

## 2023-10-06 DIAGNOSIS — L4050 Arthropathic psoriasis, unspecified: Secondary | ICD-10-CM | POA: Diagnosis not present

## 2023-10-10 ENCOUNTER — Other Ambulatory Visit: Payer: Self-pay | Admitting: Cardiology

## 2023-10-10 DIAGNOSIS — E1122 Type 2 diabetes mellitus with diabetic chronic kidney disease: Secondary | ICD-10-CM | POA: Diagnosis not present

## 2023-10-10 DIAGNOSIS — L4050 Arthropathic psoriasis, unspecified: Secondary | ICD-10-CM | POA: Diagnosis not present

## 2023-10-10 DIAGNOSIS — E1169 Type 2 diabetes mellitus with other specified complication: Secondary | ICD-10-CM | POA: Diagnosis not present

## 2023-10-10 DIAGNOSIS — Z6826 Body mass index (BMI) 26.0-26.9, adult: Secondary | ICD-10-CM | POA: Diagnosis not present

## 2023-10-10 DIAGNOSIS — E782 Mixed hyperlipidemia: Secondary | ICD-10-CM | POA: Diagnosis not present

## 2023-10-13 DIAGNOSIS — M109 Gout, unspecified: Secondary | ICD-10-CM | POA: Diagnosis not present

## 2023-10-13 DIAGNOSIS — L4050 Arthropathic psoriasis, unspecified: Secondary | ICD-10-CM | POA: Diagnosis not present

## 2023-11-13 DIAGNOSIS — L4050 Arthropathic psoriasis, unspecified: Secondary | ICD-10-CM | POA: Diagnosis not present

## 2023-11-13 DIAGNOSIS — M109 Gout, unspecified: Secondary | ICD-10-CM | POA: Diagnosis not present

## 2023-11-30 DIAGNOSIS — M7989 Other specified soft tissue disorders: Secondary | ICD-10-CM | POA: Diagnosis not present

## 2023-11-30 DIAGNOSIS — M79676 Pain in unspecified toe(s): Secondary | ICD-10-CM | POA: Diagnosis not present

## 2023-11-30 DIAGNOSIS — Z79899 Other long term (current) drug therapy: Secondary | ICD-10-CM | POA: Diagnosis not present

## 2023-11-30 DIAGNOSIS — M79642 Pain in left hand: Secondary | ICD-10-CM | POA: Diagnosis not present

## 2023-11-30 DIAGNOSIS — M79672 Pain in left foot: Secondary | ICD-10-CM | POA: Diagnosis not present

## 2023-11-30 DIAGNOSIS — L409 Psoriasis, unspecified: Secondary | ICD-10-CM | POA: Diagnosis not present

## 2023-11-30 DIAGNOSIS — M79671 Pain in right foot: Secondary | ICD-10-CM | POA: Diagnosis not present

## 2023-11-30 DIAGNOSIS — M1990 Unspecified osteoarthritis, unspecified site: Secondary | ICD-10-CM | POA: Diagnosis not present

## 2023-11-30 DIAGNOSIS — M549 Dorsalgia, unspecified: Secondary | ICD-10-CM | POA: Diagnosis not present

## 2023-11-30 DIAGNOSIS — L4050 Arthropathic psoriasis, unspecified: Secondary | ICD-10-CM | POA: Diagnosis not present

## 2023-11-30 DIAGNOSIS — M810 Age-related osteoporosis without current pathological fracture: Secondary | ICD-10-CM | POA: Diagnosis not present

## 2023-11-30 DIAGNOSIS — M79641 Pain in right hand: Secondary | ICD-10-CM | POA: Diagnosis not present

## 2023-12-05 DIAGNOSIS — L4050 Arthropathic psoriasis, unspecified: Secondary | ICD-10-CM | POA: Diagnosis not present

## 2023-12-08 ENCOUNTER — Telehealth: Payer: Self-pay | Admitting: Cardiology

## 2023-12-08 NOTE — Telephone Encounter (Signed)
 Pt c/o medication issue:  1. Name of Medication: metoprolol  tartrate (LOPRESSOR ) 25 MG tablet   2. How are you currently taking this medication (dosage and times per day)?  Take 1 tablet (25 mg total) by mouth 2 (two) times daily.       3. Are you having a reaction (difficulty breathing--STAT)? No  4. What is your medication issue? Pt is requesting a callback regarding this medication causing her to feel off balance, sleepy and light headed. Please advise.

## 2023-12-08 NOTE — Telephone Encounter (Signed)
 Pt states that since losing a lot of weight when she take her Metoprolol  in the morning approximately 1 hour after the medication she is unable to function, lightheaded and falling. Pt states that her BP has been 128/70 and does not keep up with her heart rate. The BP reading is not at any specific time of day in relation to when she take her medication. Please advise. Pt is currently taking Lopressor  25 mg BID

## 2023-12-09 MED ORDER — METOPROLOL SUCCINATE ER 25 MG PO TB24: 12.5000 mg | ORAL_TABLET | Freq: Every day | ORAL | 3 refills | Status: DC

## 2023-12-09 NOTE — Telephone Encounter (Signed)
 Recommendations reviewed with pt as per Dr. Madireddy's note.  Pt verbalized understanding and had no additional questions.

## 2023-12-13 DIAGNOSIS — L4050 Arthropathic psoriasis, unspecified: Secondary | ICD-10-CM | POA: Diagnosis not present

## 2023-12-13 DIAGNOSIS — M109 Gout, unspecified: Secondary | ICD-10-CM | POA: Diagnosis not present

## 2024-01-30 DIAGNOSIS — L4050 Arthropathic psoriasis, unspecified: Secondary | ICD-10-CM | POA: Diagnosis not present

## 2024-02-13 DIAGNOSIS — L4050 Arthropathic psoriasis, unspecified: Secondary | ICD-10-CM | POA: Diagnosis not present

## 2024-02-13 DIAGNOSIS — M109 Gout, unspecified: Secondary | ICD-10-CM | POA: Diagnosis not present

## 2024-02-17 ENCOUNTER — Other Ambulatory Visit: Payer: Self-pay | Admitting: Family Medicine

## 2024-02-17 DIAGNOSIS — Z1231 Encounter for screening mammogram for malignant neoplasm of breast: Secondary | ICD-10-CM

## 2024-02-22 ENCOUNTER — Ambulatory Visit
Admission: RE | Admit: 2024-02-22 | Discharge: 2024-02-22 | Disposition: A | Discharge status: Home / Self Care | Source: Ambulatory Visit | Attending: Family Medicine | Admitting: Family Medicine

## 2024-02-22 DIAGNOSIS — Z1231 Encounter for screening mammogram for malignant neoplasm of breast: Secondary | ICD-10-CM | POA: Diagnosis not present

## 2024-03-05 DIAGNOSIS — E1169 Type 2 diabetes mellitus with other specified complication: Secondary | ICD-10-CM | POA: Diagnosis not present

## 2024-03-05 DIAGNOSIS — E1122 Type 2 diabetes mellitus with diabetic chronic kidney disease: Secondary | ICD-10-CM | POA: Diagnosis not present

## 2024-03-05 DIAGNOSIS — E039 Hypothyroidism, unspecified: Secondary | ICD-10-CM | POA: Diagnosis not present

## 2024-03-07 DIAGNOSIS — M810 Age-related osteoporosis without current pathological fracture: Secondary | ICD-10-CM | POA: Diagnosis not present

## 2024-03-07 DIAGNOSIS — M1990 Unspecified osteoarthritis, unspecified site: Secondary | ICD-10-CM | POA: Diagnosis not present

## 2024-03-07 DIAGNOSIS — M549 Dorsalgia, unspecified: Secondary | ICD-10-CM | POA: Diagnosis not present

## 2024-03-07 DIAGNOSIS — L409 Psoriasis, unspecified: Secondary | ICD-10-CM | POA: Diagnosis not present

## 2024-03-07 DIAGNOSIS — Z79899 Other long term (current) drug therapy: Secondary | ICD-10-CM | POA: Diagnosis not present

## 2024-03-07 DIAGNOSIS — M7989 Other specified soft tissue disorders: Secondary | ICD-10-CM | POA: Diagnosis not present

## 2024-03-07 DIAGNOSIS — L4050 Arthropathic psoriasis, unspecified: Secondary | ICD-10-CM | POA: Diagnosis not present

## 2024-03-08 DIAGNOSIS — I2510 Atherosclerotic heart disease of native coronary artery without angina pectoris: Secondary | ICD-10-CM | POA: Diagnosis not present

## 2024-03-08 DIAGNOSIS — I252 Old myocardial infarction: Secondary | ICD-10-CM | POA: Diagnosis not present

## 2024-03-08 DIAGNOSIS — R079 Chest pain, unspecified: Secondary | ICD-10-CM | POA: Diagnosis not present

## 2024-03-08 DIAGNOSIS — Z87891 Personal history of nicotine dependence: Secondary | ICD-10-CM | POA: Diagnosis not present

## 2024-03-08 DIAGNOSIS — R42 Dizziness and giddiness: Secondary | ICD-10-CM | POA: Diagnosis not present

## 2024-03-08 DIAGNOSIS — Z79899 Other long term (current) drug therapy: Secondary | ICD-10-CM | POA: Diagnosis not present

## 2024-03-08 DIAGNOSIS — Z7901 Long term (current) use of anticoagulants: Secondary | ICD-10-CM | POA: Diagnosis not present

## 2024-03-12 DIAGNOSIS — Z1339 Encounter for screening examination for other mental health and behavioral disorders: Secondary | ICD-10-CM | POA: Diagnosis not present

## 2024-03-12 DIAGNOSIS — E782 Mixed hyperlipidemia: Secondary | ICD-10-CM | POA: Diagnosis not present

## 2024-03-12 DIAGNOSIS — Z136 Encounter for screening for cardiovascular disorders: Secondary | ICD-10-CM | POA: Diagnosis not present

## 2024-03-12 DIAGNOSIS — Z1331 Encounter for screening for depression: Secondary | ICD-10-CM | POA: Diagnosis not present

## 2024-03-12 DIAGNOSIS — E1169 Type 2 diabetes mellitus with other specified complication: Secondary | ICD-10-CM | POA: Diagnosis not present

## 2024-03-12 DIAGNOSIS — Z6826 Body mass index (BMI) 26.0-26.9, adult: Secondary | ICD-10-CM | POA: Diagnosis not present

## 2024-03-12 DIAGNOSIS — Z0000 Encounter for general adult medical examination without abnormal findings: Secondary | ICD-10-CM | POA: Diagnosis not present

## 2024-03-12 DIAGNOSIS — Z1389 Encounter for screening for other disorder: Secondary | ICD-10-CM | POA: Diagnosis not present

## 2024-03-12 DIAGNOSIS — Z139 Encounter for screening, unspecified: Secondary | ICD-10-CM | POA: Diagnosis not present

## 2024-03-14 DIAGNOSIS — L4050 Arthropathic psoriasis, unspecified: Secondary | ICD-10-CM | POA: Diagnosis not present

## 2024-03-14 DIAGNOSIS — M109 Gout, unspecified: Secondary | ICD-10-CM | POA: Diagnosis not present

## 2024-03-14 DIAGNOSIS — H1131 Conjunctival hemorrhage, right eye: Secondary | ICD-10-CM | POA: Diagnosis not present

## 2024-03-16 DIAGNOSIS — M810 Age-related osteoporosis without current pathological fracture: Secondary | ICD-10-CM | POA: Diagnosis not present

## 2024-03-28 DIAGNOSIS — L4050 Arthropathic psoriasis, unspecified: Secondary | ICD-10-CM | POA: Diagnosis not present

## 2024-04-14 DIAGNOSIS — M109 Gout, unspecified: Secondary | ICD-10-CM | POA: Diagnosis not present

## 2024-04-14 DIAGNOSIS — L4050 Arthropathic psoriasis, unspecified: Secondary | ICD-10-CM | POA: Diagnosis not present

## 2024-04-25 DIAGNOSIS — I472 Ventricular tachycardia, unspecified: Secondary | ICD-10-CM | POA: Diagnosis not present

## 2024-04-25 DIAGNOSIS — I25119 Atherosclerotic heart disease of native coronary artery with unspecified angina pectoris: Secondary | ICD-10-CM | POA: Diagnosis not present

## 2024-04-25 DIAGNOSIS — D682 Hereditary deficiency of other clotting factors: Secondary | ICD-10-CM | POA: Diagnosis not present

## 2024-04-25 DIAGNOSIS — I70209 Unspecified atherosclerosis of native arteries of extremities, unspecified extremity: Secondary | ICD-10-CM | POA: Diagnosis not present

## 2024-04-25 DIAGNOSIS — M069 Rheumatoid arthritis, unspecified: Secondary | ICD-10-CM | POA: Diagnosis not present

## 2024-04-25 DIAGNOSIS — D84821 Immunodeficiency due to drugs: Secondary | ICD-10-CM | POA: Diagnosis not present

## 2024-04-25 DIAGNOSIS — R32 Unspecified urinary incontinence: Secondary | ICD-10-CM | POA: Diagnosis not present

## 2024-04-25 DIAGNOSIS — D6869 Other thrombophilia: Secondary | ICD-10-CM | POA: Diagnosis not present

## 2024-04-25 DIAGNOSIS — I471 Supraventricular tachycardia, unspecified: Secondary | ICD-10-CM | POA: Diagnosis not present

## 2024-04-27 DIAGNOSIS — Z6826 Body mass index (BMI) 26.0-26.9, adult: Secondary | ICD-10-CM | POA: Diagnosis not present

## 2024-04-27 DIAGNOSIS — H6122 Impacted cerumen, left ear: Secondary | ICD-10-CM | POA: Diagnosis not present

## 2024-04-27 DIAGNOSIS — J0110 Acute frontal sinusitis, unspecified: Secondary | ICD-10-CM | POA: Diagnosis not present

## 2024-04-27 DIAGNOSIS — R051 Acute cough: Secondary | ICD-10-CM | POA: Diagnosis not present

## 2024-05-02 DIAGNOSIS — H6122 Impacted cerumen, left ear: Secondary | ICD-10-CM | POA: Diagnosis not present

## 2024-05-14 DIAGNOSIS — M109 Gout, unspecified: Secondary | ICD-10-CM | POA: Diagnosis not present

## 2024-05-14 DIAGNOSIS — L4050 Arthropathic psoriasis, unspecified: Secondary | ICD-10-CM | POA: Diagnosis not present

## 2024-05-16 DIAGNOSIS — H6993 Unspecified Eustachian tube disorder, bilateral: Secondary | ICD-10-CM | POA: Diagnosis not present

## 2024-05-16 DIAGNOSIS — R002 Palpitations: Secondary | ICD-10-CM | POA: Diagnosis not present

## 2024-05-16 DIAGNOSIS — Z6826 Body mass index (BMI) 26.0-26.9, adult: Secondary | ICD-10-CM | POA: Diagnosis not present

## 2024-05-31 NOTE — Progress Notes (Unsigned)
 Cardiology Office Note:    Date:  05/31/2024   ID:  Ann Lester, DOB 1945-08-12, MRN [id number]  PCP:  Ann Hun, MD  Cardiologist:  Ann Leiter, MD    Referring MD: Ann Hun, MD    ASSESSMENT:    1. Coronary artery disease involving native coronary artery of native heart without angina pectoris   2. Factor V Leiden mutation   3. Chronic anticoagulation   4. Essential hypertension   5. Hyperlipidemia LDL goal <70    PLAN:    In order of problems listed above:  ***   Next appointment: ***   Medication Adjustments/Labs and Tests Ordered: Current medicines are reviewed at length with the patient today.  Concerns regarding medicines are outlined above.  No orders of the defined types were placed in this encounter.  No orders of the defined types were placed in this encounter.    History of Present Illness:    Ann Lester is a 78 y.o. female with a hx of factor V Leiden mutation with long-term anticoagulation previous SVT coronary artery calcification angina with normal coronary angiography and more recently cardiac CTA February 2024 showing mild nonobstructive CAD and elevated coronary calcium score 471 last seen 09/16/2022. Compliance with diet, lifestyle and medications: *** Past Medical History:  Diagnosis Date   Chest tightness 05/16/2015   Closed displaced fracture of left femoral neck (HCC) 10/23/2021   Controlled diabetes mellitus with peripheral circulatory disorder (HCC) 04/13/2022   Coronary artery disease    Hip fracture (HCC) 10/23/2021   History of pulmonary embolism 05/16/2015   Hyperlipidemia    Hypertension    Obesity 10/23/2021   Osteoporosis 04/13/2022   Palpitations 04/13/2022   Psoriatic arthritis (HCC)    Trochanteric bursitis of left hip 12/03/2022   Vitamin D  deficiency 10/23/2021    Current Medications: Active Medications[1]    EKGs/Labs/Other Studies Reviewed:    The following studies were reviewed  today:  Cardiac Studies & Procedures   ______________________________________________________________________________________________   STRESS TESTS  MYOCARDIAL PERFUSION IMAGING 07/11/2021      MONITORS  LONG TERM MONITOR (3-14 DAYS) 03/02/2022   CT SCANS  CT CORONARY MORPH W/CTA COR W/SCORE 07/21/2022  Addendum 07/22/2022  9:10 PM ADDENDUM REPORT: 07/22/2022 21:07  EXAM: OVER-READ INTERPRETATION  CT CHEST  The following report is an over-read performed by radiologist Ann Lester [facility] on 07/22/2022. This over-read does not include interpretation of cardiac or coronary anatomy or pathology. The coronary CTA interpretation by the cardiologist is attached.  COMPARISON:  02/08/2022  FINDINGS: Heart is normal size. Aorta normal caliber. No adenopathy. No confluent airspace opacities or effusions. No acute findings in the upper abdomen. Cyst within the left hepatic lobe is stable since prior study. Chest wall soft tissues are unremarkable. No acute bony abnormality.  IMPRESSION: No acute or significant extracardiac abnormality.   Electronically Signed By: Ann Crease M.D. On: 07/22/2022 21:07  Narrative CLINICAL DATA:  Chest pain  EXAM: Cardiac CTA  MEDICATIONS: Sub lingual nitro. 4mg  and lopressor  100mg   TECHNIQUE: The patient was scanned on a Siemens Force 192 slice scanner. Gantry rotation speed was 250 msecs. Collimation was .6 mm. A 120 kV prospective scan was triggered in the ascending thoracic aorta at 140 HU's Full mA was used between 35% and 75% of the R-R interval. Average HR during the scan was 58 bpm. The 3D data set was interpreted on a dedicated work station using MPR, MIP and VRT modes. A total of  80cc of contrast was used.  FINDINGS: Non-cardiac: See separate report from [facility]. No significant findings on limited lung and soft tissue windows.  Calcium Score: LM and 3 vessel calcium  LM: 30.5  LAD  151  LCX 29.9  RCA 260  Total: 471  Coronary Arteries: Right dominant with no anomalies  LM: 1-24% ostial calcified plaque  LAD: 25-49% calcified plaque proximally 1-24% calcified plaque mid vessel  D1: Normal  D2: Normal  Circumflex: 1-24% calcified plaque ostium  OM1: Normal  AV groove: Normal  RCA: 1-24% calcified plaque proximal and mid vessel 25-49% calcified plaque distally  PDA: Normal  PLA: 1-24% calcified plaque  IMPRESSION: 1.  CAD RADS 2 no obstructive CAD see description above  2.  Normal ascending thoracic aorta 3.1 cm  3.  Calcium score 471 which is 85 th percentile for age/sex  Ann Lester  Electronically Signed: By: Ann Lester M.D. On: 07/21/2022 13:26     ______________________________________________________________________________________________          Recent Labs: No results found for requested labs within last 365 days.  Recent Lipid Panel No results found for: CHOL, TRIG, HDL, CHOLHDL, VLDL, LDLCALC, LDLDIRECT  Physical Exam:    VS:  There were no vitals taken for this visit.    Wt Readings from Last 3 Encounters:  08/11/23 159 lb (72.1 kg)  04/14/23 155 lb (70.3 kg)  09/16/22 150 lb (68 kg)     GEN: *** Well nourished, well developed in no acute distress HEENT: Normal NECK: No JVD; No carotid bruits LYMPHATICS: No lymphadenopathy CARDIAC: ***RRR, no murmurs, rubs, gallops RESPIRATORY:  Clear to auscultation without rales, wheezing or rhonchi  ABDOMEN: Soft, non-tender, non-distended MUSCULOSKELETAL:  No edema; No deformity  SKIN: Warm and dry NEUROLOGIC:  Alert and oriented x 3 PSYCHIATRIC:  Normal affect    Signed, Ann Leiter, MD  05/31/2024 7:30 PM    [facility]     [1]  No outpatient medications have been marked as taking for the 06/01/24 encounter (Appointment) with Lester Ann PARAS, MD.

## 2024-06-01 ENCOUNTER — Encounter: Payer: Self-pay | Admitting: Cardiology

## 2024-06-01 ENCOUNTER — Ambulatory Visit: Attending: Cardiology | Admitting: Cardiology

## 2024-06-01 VITALS — BP 130/68 | HR 73 | Ht 66.0 in | Wt 166.6 lb

## 2024-06-01 DIAGNOSIS — I2510 Atherosclerotic heart disease of native coronary artery without angina pectoris: Secondary | ICD-10-CM | POA: Diagnosis not present

## 2024-06-01 DIAGNOSIS — I10 Essential (primary) hypertension: Secondary | ICD-10-CM

## 2024-06-01 DIAGNOSIS — D6851 Activated protein C resistance: Secondary | ICD-10-CM | POA: Diagnosis not present

## 2024-06-01 DIAGNOSIS — R5381 Other malaise: Secondary | ICD-10-CM | POA: Diagnosis not present

## 2024-06-01 DIAGNOSIS — Z7901 Long term (current) use of anticoagulants: Secondary | ICD-10-CM | POA: Diagnosis not present

## 2024-06-01 DIAGNOSIS — I491 Atrial premature depolarization: Secondary | ICD-10-CM | POA: Diagnosis not present

## 2024-06-01 DIAGNOSIS — E785 Hyperlipidemia, unspecified: Secondary | ICD-10-CM | POA: Diagnosis not present

## 2024-06-01 MED ORDER — EZETIMIBE 10 MG PO TABS: 10.0000 mg | ORAL_TABLET | Freq: Every day | ORAL | 3 refills | Status: AC

## 2024-06-01 MED ORDER — DILTIAZEM HCL ER COATED BEADS 240 MG PO CP24: 240.0000 mg | ORAL_CAPSULE | Freq: Every day | ORAL | 3 refills | Status: AC

## 2024-06-01 NOTE — Patient Instructions (Signed)
 Medication Instructions:  Your physician has recommended you make the following change in your medication:   START: Cardizem  CD 240 mg daily START: Zetia  10 mg daily STOP: Metoprolol  succinate STOP: Amlodipine   *If you need a refill on your cardiac medications before your next appointment, please call your pharmacy*  Lab Work: Your physician recommends that you return for lab work in:   Labs in 2 month: CMP, Lipids, Apo B  If you have labs (blood work) drawn today and your tests are completely normal, you will receive your results only by: MyChart Message (if you have MyChart) OR A paper copy in the mail If you have any lab test that is abnormal or we need to change your treatment, we will call you to review the results.  Testing/Procedures: None  Follow-Up: At [facility], you and your health needs are our priority.  As part of our continuing mission to provide you with exceptional heart care, our providers are all part of one team.  This team includes your primary Cardiologist (physician) and Advanced Practice Providers or APPs (Physician Assistants and Nurse Practitioners) who all work together to provide you with the care you need, when you need it.  Your next appointment:   1 year(s)  Provider:   Redell Leiter, MD    We recommend signing up for the patient portal called MyChart.  Sign up information is provided on this After Visit Summary.  MyChart is used to connect with patients for Virtual Visits (Telemedicine).  Patients are able to view lab/test results, encounter notes, upcoming appointments, etc.  Non-urgent messages can be sent to your provider as well.   To learn more about what you can do with MyChart, go to [email or web address].   Other Instructions None

## 2024-06-04 ENCOUNTER — Telehealth: Payer: Self-pay | Admitting: *Deleted

## 2024-06-04 MED ORDER — RANOLAZINE ER 1000 MG PO TB12: 1000.0000 mg | ORAL_TABLET | Freq: Two times a day (BID) | ORAL | 3 refills | Status: AC

## 2024-06-04 NOTE — Telephone Encounter (Signed)
 Rx refill sent to pharmacy.

## 2024-06-13 ENCOUNTER — Other Ambulatory Visit (HOSPITAL_COMMUNITY): Payer: Self-pay

## 2024-06-13 ENCOUNTER — Ambulatory Visit: Payer: Self-pay | Admitting: Cardiology

## 2024-06-13 ENCOUNTER — Telehealth: Payer: Self-pay | Admitting: Pharmacy Technician

## 2024-06-13 DIAGNOSIS — E785 Hyperlipidemia, unspecified: Secondary | ICD-10-CM

## 2024-06-13 LAB — COMPREHENSIVE METABOLIC PANEL WITH GFR
ALT: 32 IU/L (ref 0–32)
AST: 37 IU/L (ref 0–40)
Albumin: 4.2 g/dL (ref 3.8–4.8)
Alkaline Phosphatase: 45 IU/L — ABNORMAL LOW (ref 49–135)
BUN/Creatinine Ratio: 23 (ref 12–28)
BUN: 20 mg/dL (ref 8–27)
Bilirubin Total: 0.6 mg/dL (ref 0.0–1.2)
CO2: 21 mmol/L (ref 20–29)
Calcium: 8.7 mg/dL (ref 8.7–10.3)
Chloride: 105 mmol/L (ref 96–106)
Creatinine, Ser: 0.86 mg/dL (ref 0.57–1.00)
Globulin, Total: 1.9 g/dL (ref 1.5–4.5)
Glucose: 96 mg/dL (ref 70–99)
Potassium: 4.1 mmol/L (ref 3.5–5.2)
Sodium: 141 mmol/L (ref 134–144)
Total Protein: 6.1 g/dL (ref 6.0–8.5)
eGFR: 70 mL/min/1.73

## 2024-06-13 LAB — LIPID PANEL
Chol/HDL Ratio: 2.3 ratio (ref 0.0–4.4)
Cholesterol, Total: 257 mg/dL — ABNORMAL HIGH (ref 100–199)
HDL: 114 mg/dL
LDL Chol Calc (NIH): 126 mg/dL — ABNORMAL HIGH (ref 0–99)
Triglycerides: 104 mg/dL (ref 0–149)
VLDL Cholesterol Cal: 17 mg/dL (ref 5–40)

## 2024-06-13 LAB — APOLIPOPROTEIN B: Apolipoprotein B: 99 mg/dL — ABNORMAL HIGH

## 2024-06-13 MED ORDER — REPATHA SURECLICK 140 MG/ML ~~LOC~~ SOAJ: 140.0000 mg | SUBCUTANEOUS | 2 refills | Status: AC

## 2024-06-13 NOTE — Telephone Encounter (Signed)
 Pharmacy Patient Advocate Encounter   Received notification from Physician's Office that prior authorization for repatha is required/requested.   Insurance verification completed.   The patient is insured through [employer].   Per test claim: PA required; PA submitted to above mentioned insurance via Latent Key/confirmation #/EOC [id number] Status is pending

## 2024-06-13 NOTE — Telephone Encounter (Signed)
 Pharmacy Patient Advocate Encounter  Received notification from [employer] that Prior Authorization for Repatha has been APPROVED from 06/13/24 to 06/13/25. Ran test claim, Copay is $0.00- one month. This test claim was processed through [facility]- copay amounts may vary at other pharmacies due to pharmacy/plan contracts, or as the patient moves through the different stages of their insurance plan.   PA #/Case ID/Reference #:  [id number]

## 2024-06-15 ENCOUNTER — Encounter: Payer: Self-pay | Admitting: Cardiology
